# Patient Record
Sex: Female | Born: 1955 | Race: Black or African American | Hispanic: No | Marital: Married | Smoking: Never smoker
Health system: Southern US, Community
[De-identification: ages and names within clinical notes are randomized; demographics above are authoritative.]

## PROBLEM LIST (undated history)

## (undated) DIAGNOSIS — R51 Headache: Secondary | ICD-10-CM

## (undated) DIAGNOSIS — M5126 Other intervertebral disc displacement, lumbar region: Secondary | ICD-10-CM

## (undated) DIAGNOSIS — I2510 Atherosclerotic heart disease of native coronary artery without angina pectoris: Secondary | ICD-10-CM

## (undated) DIAGNOSIS — I10 Essential (primary) hypertension: Secondary | ICD-10-CM

## (undated) DIAGNOSIS — H269 Unspecified cataract: Secondary | ICD-10-CM

## (undated) DIAGNOSIS — E7800 Pure hypercholesterolemia, unspecified: Secondary | ICD-10-CM

## (undated) DIAGNOSIS — K219 Gastro-esophageal reflux disease without esophagitis: Secondary | ICD-10-CM

## (undated) DIAGNOSIS — R519 Headache, unspecified: Secondary | ICD-10-CM

## (undated) DIAGNOSIS — T7840XA Allergy, unspecified, initial encounter: Secondary | ICD-10-CM

## (undated) HISTORY — DX: Other intervertebral disc displacement, lumbar region: M51.26

## (undated) HISTORY — DX: Essential (primary) hypertension: I10

## (undated) HISTORY — DX: Unspecified cataract: H26.9

## (undated) HISTORY — DX: Allergy, unspecified, initial encounter: T78.40XA

---

## 2013-10-19 ENCOUNTER — Ambulatory Visit (INDEPENDENT_AMBULATORY_CARE_PROVIDER_SITE_OTHER): Payer: No Typology Code available for payment source | Admitting: Physician Assistant

## 2013-10-19 VITALS — BP 126/88 | HR 77 | Temp 98.2°F | Resp 16 | Ht 60.75 in | Wt 183.0 lb

## 2013-10-19 DIAGNOSIS — Z111 Encounter for screening for respiratory tuberculosis: Secondary | ICD-10-CM

## 2013-10-19 DIAGNOSIS — J309 Allergic rhinitis, unspecified: Secondary | ICD-10-CM

## 2013-10-19 DIAGNOSIS — I10 Essential (primary) hypertension: Secondary | ICD-10-CM

## 2013-10-19 DIAGNOSIS — E785 Hyperlipidemia, unspecified: Secondary | ICD-10-CM

## 2013-10-19 DIAGNOSIS — K219 Gastro-esophageal reflux disease without esophagitis: Secondary | ICD-10-CM

## 2013-10-19 LAB — LIPID PANEL
CHOLESTEROL: 229 mg/dL — AB (ref 0–200)
HDL: 108 mg/dL (ref >39)
LDL Cholesterol: 111 mg/dL — ABNORMAL HIGH (ref 0–99)
TRIGLYCERIDES: 51 mg/dL (ref <150)
Total CHOL/HDL Ratio: 2.1 Ratio
VLDL: 10 mg/dL (ref 0–40)

## 2013-10-19 LAB — COMPREHENSIVE METABOLIC PANEL
ALT: 26 U/L (ref 0–35)
AST: 26 U/L (ref 0–37)
Albumin: 4.7 g/dL (ref 3.5–5.2)
Alkaline Phosphatase: 52 U/L (ref 39–117)
BILIRUBIN TOTAL: 0.4 mg/dL (ref 0.2–1.2)
BUN: 15 mg/dL (ref 6–23)
CHLORIDE: 104 meq/L (ref 96–112)
CO2: 29 mEq/L (ref 19–32)
Calcium: 10.2 mg/dL (ref 8.4–10.5)
Creat: 0.87 mg/dL (ref 0.50–1.10)
GLUCOSE: 95 mg/dL (ref 70–99)
Potassium: 4.2 mEq/L (ref 3.5–5.3)
SODIUM: 141 meq/L (ref 135–145)
Total Protein: 7.6 g/dL (ref 6.0–8.3)

## 2013-10-19 MED ORDER — HYDROCHLOROTHIAZIDE 25 MG PO TABS: 25.0000 mg | ORAL_TABLET | Freq: Every day | ORAL | Status: DC

## 2013-10-19 MED ORDER — FLUTICASONE PROPIONATE 50 MCG/ACT NA SUSP: 2.0000 | Freq: Every day | NASAL | Status: DC

## 2013-10-19 MED ORDER — ESOMEPRAZOLE MAGNESIUM 10 MG PO PACK
10.0000 mg | PACK | Freq: Every day | ORAL | Status: DC
Start: 2013-10-19 — End: 2014-04-01

## 2013-10-19 MED ORDER — LORATADINE 10 MG PO TABS: 10.0000 mg | ORAL_TABLET | Freq: Every day | ORAL | Status: DC

## 2013-10-19 MED ORDER — PRAVASTATIN SODIUM 80 MG PO TABS: 80.0000 mg | ORAL_TABLET | Freq: Every day | ORAL | Status: DC

## 2013-10-19 NOTE — Progress Notes (Signed)
Subjective:    Patient ID: Malena Edman, female    DOB: 29-Oct-1955, 58 y.o.   MRN: [id number]  HPI Primary Physician: No primary provider on file.  Chief Complaint: Medication refill, allergies, and PPD  HPI: 58 y.o. female with history below presents for several issues. Recently moved here from [facility] She moved here   1) Medication refill: Needs refills of HCTZ 25 mg and pravastatin 80 mg. Has been on these for over 10 years. Sweets and breads are her weaknesses. She does walk daily.   2) Allergies: Long term allergy issues her whole life. Has been on Claritin 10 mg daily. This helps for the most part, but sometimes her ears bother her. They feel full sometimes. She has to deal with itchy watery eyes at times.   3) PPD: Needs for CNA school. States her aunt did have TB. Aunt was treated. Patient does nto remember if she received PPX treatment or not. Also had a remote history of crack/cocaine in the 1980's. She states that she was never addicted. She checked herself into the hospital for help. Has been clean since. Asymptomatic from possible TB exposure early in life.    Past Medical History  Diagnosis Date  . Allergy   . Cataract   . Hypertension      Home Meds: Prior to Admission medications   Medication Sig Start Date End Date Taking? Authorizing Provider  esomeprazole (NEXIUM) 10 MG packet Take 10 mg by mouth daily before breakfast.   Yes Historical Provider, MD  hydrochlorothiazide (HYDRODIURIL) 25 MG tablet Take 25 mg by mouth daily.   Yes Historical Provider, MD  loratadine (CLARITIN) 10 MG tablet Take 10 mg by mouth daily.   Yes Historical Provider, MD  pravastatin (PRAVACHOL) 80 MG tablet Take 80 mg by mouth daily.   Yes Historical Provider, MD    Allergies:  Allergies  Allergen Reactions  . Flagyl [Metronidazole] Rash    History   Social History  . Marital Status: Married    Spouse Name: N/A    Number of Children: N/A  . Years of Education: N/A   Occupational  History  . Not on file.   Social History Main Topics  . Smoking status: Never Smoker   . Smokeless tobacco: Not on file  . Alcohol Use: No  . Drug Use: No  . Sexual Activity: Not on file   Other Topics Concern  . Not on file   Social History Narrative  . No narrative on file      Review of Systems  Constitutional: Negative for fever, chills and fatigue.  HENT: Positive for congestion, ear pain, hearing loss, postnasal drip, rhinorrhea, sinus pressure and sneezing. Negative for sore throat and tinnitus.   Eyes: Positive for discharge and itching.       Watery.   Respiratory: Negative for cough, chest tightness and wheezing.   Cardiovascular: Negative for chest pain.  Neurological: Positive for headaches.       Objective:   Physical Exam  Physical Exam: Blood pressure 126/88, pulse 77, temperature 98.2 F (36.8 C), temperature source Oral, resp. rate 16, height 5' 0.75" (1.543 m), weight 183 lb (83.008 kg), SpO2 96.00%., Body mass index is 34.86 kg/(m^2). General: Well developed, well nourished, in no acute distress. Head: Normocephalic, atraumatic, eyes without discharge, sclera non-icteric, nares are without discharge. Bilateral auditory canals clear, TM's are without perforation, pearly grey and translucent with reflective cone of light bilaterally. Oral cavity moist, posterior pharynx without exudate, erythema,  peritonsillar abscess, or post nasal drip. Uvula midline.   Neck: Supple. No thyromegaly. Full ROM. No lymphadenopathy. Lungs: Clear bilaterally to auscultation without wheezes, rales, or rhonchi. Breathing is unlabored. Heart: RRR with S1 S2. No murmurs, rubs, or gallops appreciated. Msk:  Strength and tone normal for age. Extremities/Skin: Warm and dry. No clubbing or cyanosis. No edema. No rashes or suspicious lesions. Neuro: Alert and oriented X 3. Moves all extremities spontaneously. Gait is normal. CNII-XII grossly in tact. Psych:  Responds to questions  appropriately with a normal affect.   Labs: CMP and lipid pending     Assessment & Plan:  58 year old female here for PPD with hyperlipidemia, hypertension, and allergies.  1) Hyperlipidemia -Await labs -Pravastatin 80 mg 1 po qhs #30 RF 5 -Healthy diet and exercise -Weight loss -Cut back on the sweets and bread  2) Hypertension -Well controlled -Continue HCTZ 25 mg 1 po daily #30 RF 5 -Avoid beta blocker usage in the future as she does have a remote history of crack cocaine usage -Healthy diet and exercise -Weight loss  3) Allergies -Add Flonase 2 sprays each nare daily #1 RF 6 -Continue Claritin 10 mg 1 po daily #30 RF 5  4) PPD -Placed -RTC 48 72 hours for reading   Eula Listenyan Mayan Kloepfer, MHS, PA-C [facility] [address] [place], [place] [address] [phone] [facility] 10/19/2013 8:53 AM

## 2013-10-19 NOTE — Progress Notes (Signed)
  Tuberculosis Risk Questionnaire  1. No Were you born outside the [place] in one of the following parts of the world: [place], [place], [place], [place] or [place]?    2. No Have you traveled outside the [place] and lived for more than one month in one of the following parts of the world: [place], [place], [place], [place] or [place]?    3. No Do you have a compromised immune system such as from any of the following conditions:HIV/AIDS, organ or bone marrow transplantation, diabetes, immunosuppressive medicines (e.g. Prednisone, Remicaide), leukemia, lymphoma, cancer of the head or neck, gastrectomy or jejunal bypass, end-stage renal disease (on dialysis), or silicosis?     4. 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    5. Yes Did illegal drugs back in 80's and got treatment Have you ever: injected illegal drugs, used crack cocaine, lived in a homeless shelter  or been in jail or prison?     6. Yes Aunt had it years ago, none recently Have you ever been exposed to anyone with infectious tuberculosis?    Tuberculosis Symptom Questionnaire  Do you currently have any of the following symptoms?  1. No Unexplained cough lasting more than 3 weeks?   2. No Unexplained fever lasting more than 3 weeks.   3. No Night Sweats (sweating that leaves the bedclothes and sheets wet)     4. No Shortness of Breath   5. No Chest Pain   6. No Unintentional weight loss    7. No Unexplained fatigue (very tired for no reason)

## 2013-10-19 NOTE — Addendum Note (Signed)
Addended byAlden Benjamin: Montrell Cessna R on: 10/19/2013 10:08 AM   Modules accepted: Orders

## 2013-10-22 ENCOUNTER — Ambulatory Visit (INDEPENDENT_AMBULATORY_CARE_PROVIDER_SITE_OTHER): Payer: No Typology Code available for payment source | Admitting: Radiology

## 2013-10-22 DIAGNOSIS — Z111 Encounter for screening for respiratory tuberculosis: Secondary | ICD-10-CM

## 2013-10-22 LAB — TB SKIN TEST
Induration: 0 mm
TB SKIN TEST: NEGATIVE

## 2013-10-22 NOTE — Progress Notes (Signed)
   Subjective:    Patient ID: Casey Peterson, female    DOB: 09/10/1956, 58 y.o.   MRN: [id number]  HPI    Review of Systems     Objective:   Physical Exam        Assessment & Plan:

## 2014-04-01 ENCOUNTER — Ambulatory Visit (INDEPENDENT_AMBULATORY_CARE_PROVIDER_SITE_OTHER): Payer: No Typology Code available for payment source | Admitting: Family Medicine

## 2014-04-01 ENCOUNTER — Encounter: Payer: Self-pay | Admitting: Family Medicine

## 2014-04-01 VITALS — BP 113/86 | HR 69 | Temp 98.5°F | Resp 16 | Ht 62.5 in | Wt 185.2 lb

## 2014-04-01 DIAGNOSIS — J302 Other seasonal allergic rhinitis: Secondary | ICD-10-CM

## 2014-04-01 DIAGNOSIS — E785 Hyperlipidemia, unspecified: Secondary | ICD-10-CM

## 2014-04-01 DIAGNOSIS — J309 Allergic rhinitis, unspecified: Secondary | ICD-10-CM

## 2014-04-01 DIAGNOSIS — R42 Dizziness and giddiness: Secondary | ICD-10-CM

## 2014-04-01 DIAGNOSIS — I10 Essential (primary) hypertension: Secondary | ICD-10-CM

## 2014-04-01 LAB — POCT URINALYSIS DIPSTICK
Bilirubin, UA: NEGATIVE
Blood, UA: NEGATIVE
Glucose, UA: NEGATIVE
Ketones, UA: NEGATIVE
Leukocytes, UA: NEGATIVE
Nitrite, UA: NEGATIVE
Protein, UA: NEGATIVE
Spec Grav, UA: 1.015
Urobilinogen, UA: 0.2
pH, UA: 5.5

## 2014-04-01 LAB — COMPLETE METABOLIC PANEL WITH GFR
ALT: 32 U/L (ref 0–35)
Alkaline Phosphatase: 48 U/L (ref 39–117)
BUN: 12 mg/dL (ref 6–23)
CO2: 31 mEq/L (ref 19–32)
Chloride: 101 mEq/L (ref 96–112)
Creat: 0.89 mg/dL (ref 0.50–1.10)
GFR, Est African American: 82 mL/min
GFR, Est Non African American: 71 mL/min
Total Bilirubin: 0.4 mg/dL (ref 0.2–1.2)

## 2014-04-01 LAB — COMPLETE METABOLIC PANEL WITHOUT GFR
AST: 29 U/L (ref 0–37)
Albumin: 4.5 g/dL (ref 3.5–5.2)
Calcium: 9.6 mg/dL (ref 8.4–10.5)
Glucose, Bld: 93 mg/dL (ref 70–99)
Potassium: 3.5 meq/L (ref 3.5–5.3)
Sodium: 141 meq/L (ref 135–145)
Total Protein: 7.4 g/dL (ref 6.0–8.3)

## 2014-04-01 LAB — POCT UA - MICROSCOPIC ONLY
Casts, Ur, LPF, POC: NEGATIVE
Crystals, Ur, HPF, POC: NEGATIVE
Mucus, UA: NEGATIVE
Yeast, UA: NEGATIVE

## 2014-04-01 LAB — LIPID PANEL
Cholesterol: 226 mg/dL — ABNORMAL HIGH (ref 0–200)
HDL: 114 mg/dL (ref >39)
LDL Cholesterol: 101 mg/dL — ABNORMAL HIGH (ref 0–99)
Total CHOL/HDL Ratio: 2 Ratio
Triglycerides: 54 mg/dL (ref <150)
VLDL: 11 mg/dL (ref 0–40)

## 2014-04-01 MED ORDER — FLUTICASONE PROPIONATE 50 MCG/ACT NA SUSP: 2.0000 | Freq: Every day | NASAL | Status: DC

## 2014-04-01 MED ORDER — OMEPRAZOLE 20 MG PO CPDR: 20.0000 mg | DELAYED_RELEASE_CAPSULE | Freq: Every day | ORAL | Status: DC

## 2014-04-01 MED ORDER — PRAVASTATIN SODIUM 80 MG PO TABS: 80.0000 mg | ORAL_TABLET | Freq: Every day | ORAL | Status: DC

## 2014-04-01 MED ORDER — HYDROCHLOROTHIAZIDE 25 MG PO TABS: 25.0000 mg | ORAL_TABLET | Freq: Every day | ORAL | Status: DC

## 2014-04-01 NOTE — Progress Notes (Signed)
Chief Complaint:  Chief Complaint  Patient presents with  . Establish Care    and medication refills - HCTZ and PRAVASTATIN    HPI: Casey Peterson is a 58 y.o. female who is here for Dizzsiness with standing and sitting, no UTI sxs/fevers/chills/n/v/adb pain/chest pain /SOB or palpitations. No ear pain or URI sxs HTN and lipids well controlled, dx over 10 years ago No SEs from meds that she knows of, she ahs been trying to keep hydrated Has allergies  And has been taking otc meds, and has  GERD, nexium too expensive so would like something else less expensive She ahs no dizziness with ROM , no neuro sxs, just usually when she bends over and gets up too quickly No h/o diabetes  Past Medical History  Diagnosis Date  . Allergy   . Cataract   . Hypertension    No past surgical history on file. History   Social History  . Marital Status: Married    Spouse Name: N/A    Number of Children: N/A  . Years of Education: N/A   Social History Main Topics  . Smoking status: Never Smoker   . Smokeless tobacco: None  . Alcohol Use: No  . Drug Use: No  . Sexual Activity: None   Other Topics Concern  . None   Social History Narrative  . None   Family History  Problem Relation Age of Onset  . Cancer Mother   . Diabetes Mother   . Heart disease Mother   . Hyperlipidemia Mother   . Hypertension Mother   . Stroke Mother   . Stroke Father   . Hypertension Father   . Hyperlipidemia Sister   . Stroke Sister   . Diabetes Sister   . Diabetes Sister    Allergies  Allergen Reactions  . Flagyl [Metronidazole] Rash   Prior to Admission medications   Medication Sig Start Date End Date Taking? Authorizing Provider  esomeprazole (NEXIUM) 10 MG packet Take 10 mg by mouth daily before breakfast. 10/19/13  Yes Ryan M Dunn, PA-C  fluticasone (FLONASE) 50 MCG/ACT nasal spray Place 2 sprays into both nostrils daily. 10/19/13  Yes Ryan M Dunn, PA-C  hydrochlorothiazide (HYDRODIURIL) 25 MG  tablet Take 1 tablet (25 mg total) by mouth daily. 10/19/13  Yes Ryan M Dunn, PA-C  loratadine (CLARITIN) 10 MG tablet Take 1 tablet (10 mg total) by mouth daily. 10/19/13  Yes Ryan M Dunn, PA-C  pravastatin (PRAVACHOL) 80 MG tablet Take 1 tablet (80 mg total) by mouth daily. 10/19/13  Yes Ryan M Dunn, PA-C     ROS: The patient denies fevers, chills, night sweats, unintentional weight loss, chest pain, palpitations, wheezing, dyspnea on exertion, nausea, vomiting, abdominal pain, dysuria, hematuria, melena, numbness, weakness, or tingling.   All other systems have been reviewed and were otherwise negative with the exception of those mentioned in the HPI and as above.    PHYSICAL EXAM: Filed Vitals:   04/01/14 1051  BP: 113/86  Pulse: 69  Temp: 98.5 F (36.9 C)  Resp: 16   Filed Vitals:   04/01/14 1051  Height: 5' 2.5" (1.588 m)  Weight: 185 lb 3.2 oz (84.006 kg)   Body mass index is 33.31 kg/(m^2).  General: Alert, no acute distress HEENT:  Normocephalic, atraumatic, oropharynx patent. EOMI, PERRLA, fundo exam grossly nl, TM normla Cardiovascular:  Regular rate and rhythm, no rubs murmurs or gallops.  No Carotid bruits, radial pulse intact. No pedal edema.  Respiratory: Clear to auscultation bilaterally.  No wheezes, rales, or rhonchi.  No cyanosis, no use of accessory musculature GI: No organomegaly, abdomen is soft and non-tender, positive bowel sounds.  No masses. Skin: No rashes. Neurologic: Facial musculature symmetric. CN 2-12 grossly normal. Neg romberg Psychiatric: Patient is appropriate throughout our interaction. Lymphatic: No cervical lymphadenopathy Musculoskeletal: Gait intact. 5/5 sstrength, 2/2 DTRs   LABS: Results for orders placed in visit on 04/01/14  COMPLETE METABOLIC PANEL WITH GFR      Result Value Ref Range   Sodium 141  135 - 145 mEq/L   Potassium 3.5  3.5 - 5.3 mEq/L   Chloride 101  96 - 112 mEq/L   CO2 31  19 - 32 mEq/L   Glucose, Bld 93  70 - 99  mg/dL   BUN 12  6 - 23 mg/dL   Creat 1.61  0.96 - 0.45 mg/dL   Total Bilirubin 0.4  0.2 - 1.2 mg/dL   Alkaline Phosphatase 48  39 - 117 U/L   AST 29  0 - 37 U/L   ALT 32  0 - 35 U/L   Total Protein 7.4  6.0 - 8.3 g/dL   Albumin 4.5  3.5 - 5.2 g/dL   Calcium 9.6  8.4 - 40.9 mg/dL   GFR, Est African American 82     GFR, Est Non African American 71    LIPID PANEL      Result Value Ref Range   Cholesterol 226 (*) 0 - 200 mg/dL   Triglycerides 54  <811 mg/dL   HDL 914  >78 mg/dL   Total CHOL/HDL Ratio 2.0     VLDL 11  0 - 40 mg/dL   LDL Cholesterol 295 (*) 0 - 99 mg/dL  TSH      Result Value Ref Range   TSH 0.743  0.350 - 4.500 uIU/mL  POCT URINALYSIS DIPSTICK      Result Value Ref Range   Color, UA yellow     Clarity, UA clear     Glucose, UA neg     Bilirubin, UA neg     Ketones, UA neg     Spec Grav, UA 1.015     Blood, UA neg     pH, UA 5.5     Protein, UA neg     Urobilinogen, UA 0.2     Nitrite, UA neg     Leukocytes, UA Negative    POCT UA - MICROSCOPIC ONLY      Result Value Ref Range   WBC, Ur, HPF, POC 0-1     RBC, urine, microscopic 0-1     Bacteria, U Microscopic trace     Mucus, UA neg     Epithelial cells, urine per micros 0-1     Crystals, Ur, HPF, POC neg     Casts, Ur, LPF, POC neg     Yeast, UA neg       EKG/XRAY:   Primary read interpreted by Dr. Conley Rolls at [facility].   ASSESSMENT/PLAN: Encounter Diagnoses  Name Primary?  . Essential hypertension Yes  . Other and unspecified hyperlipidemia   . Seasonal allergies   . Allergic rhinitis, cause unspecified   . Dizziness and giddiness    Orthostatics normal, UA normal, neuro and eye exam normal Advise to move  slowly when changing positions Labs pending F/u prn or in 6 months  Gross sideeffects, risk and benefits, and alternatives of medications d/w patient. Patient is aware that all medications  have potential sideeffects and we are unable to predict every sideeffect or drug-drug interaction that may  occur.  Traci Plemons PHUONG, DO 04/04/2014 7:22 AM

## 2014-04-02 LAB — TSH: TSH: 0.743 u[IU]/mL (ref 0.350–4.500)

## 2014-04-11 ENCOUNTER — Ambulatory Visit (INDEPENDENT_AMBULATORY_CARE_PROVIDER_SITE_OTHER): Payer: No Typology Code available for payment source | Admitting: Emergency Medicine

## 2014-04-11 VITALS — BP 114/80 | HR 72 | Temp 98.1°F | Resp 16 | Ht 62.0 in | Wt 184.8 lb

## 2014-04-11 DIAGNOSIS — J309 Allergic rhinitis, unspecified: Secondary | ICD-10-CM

## 2014-04-11 DIAGNOSIS — H9201 Otalgia, right ear: Secondary | ICD-10-CM

## 2014-04-11 DIAGNOSIS — H9209 Otalgia, unspecified ear: Secondary | ICD-10-CM

## 2014-04-11 DIAGNOSIS — Z23 Encounter for immunization: Secondary | ICD-10-CM

## 2014-04-11 NOTE — Patient Instructions (Signed)

## 2014-04-11 NOTE — Progress Notes (Signed)
[facility] [address], [place] [address] [phone]  Date:  04/11/2014   Name:  Casey EdmanMary Ricard   DOB:  10/08/1955   MRN:  [id number]  PCP:  Rockne CoonsLE, THAO PHUONG, DO    Chief Complaint: Otalgia and Annual Exam   History of Present Illness:  Casey EdmanMary Sagar is a 58 y.o. very pleasant female patient who presents with the following:  Comes with "muscle spasms" in right ear. History of ear pain in past.  Saw Dr Conley RollsLe for labs last week with physical.   No fever or chills.  No nausea or vomiting.  No cough or coryza.  No improvement with over the counter medications or other home remedies. Denies other complaint or health concern today.   There are no active problems to display for this patient.   Past Medical History  Diagnosis Date  . Allergy   . Cataract   . Hypertension     History reviewed. No pertinent past surgical history.  History  Substance Use Topics  . Smoking status: Never Smoker   . Smokeless tobacco: Not on file  . Alcohol Use: No    Family History  Problem Relation Age of Onset  . Cancer Mother   . Diabetes Mother   . Heart disease Mother   . Hyperlipidemia Mother   . Hypertension Mother   . Stroke Mother   . Stroke Father   . Hypertension Father   . Hyperlipidemia Sister   . Stroke Sister   . Diabetes Sister   . Diabetes Sister     Allergies  Allergen Reactions  . Flagyl [Metronidazole] Rash    Medication list has been reviewed and updated.  Current Outpatient Prescriptions on File Prior to Visit  Medication Sig Dispense Refill  . fluticasone (FLONASE) 50 MCG/ACT nasal spray Place 2 sprays into both nostrils daily.  16 g  6  . hydrochlorothiazide (HYDRODIURIL) 25 MG tablet Take 1 tablet (25 mg total) by mouth daily.  30 tablet  5  . loratadine (CLARITIN) 10 MG tablet Take 1 tablet (10 mg total) by mouth daily.  30 tablet  5  . omeprazole (PRILOSEC) 20 MG capsule Take 1 capsule (20 mg total) by mouth daily.  30 capsule  5  .  pravastatin (PRAVACHOL) 80 MG tablet Take 1 tablet (80 mg total) by mouth daily.  30 tablet  5   No current facility-administered medications on file prior to visit.    Review of Systems:  As per HPI, otherwise negative.    Physical Examination: Filed Vitals:   04/11/14 0948  BP: 114/80  Pulse: 72  Temp: 98.1 F (36.7 C)  Resp: 16   Filed Vitals:   04/11/14 0948  Height: 5\' 2"  (1.575 m)  Weight: 184 lb 12.8 oz (83.825 kg)   Body mass index is 33.79 kg/(m^2). Ideal Body Weight: Weight in (lb) to have BMI = 25: 136.4   GEN: WDWN, NAD, Non-toxic, A & O x 3 HEENT: Atraumatic, Normocephalic. Neck supple. No masses, No LAD.  Valsalva positive Ears and Nose: No external deformity. CV: RRR, No M/G/R. No JVD. No thrill. No extra heart sounds. PULM: CTA B, no wheezes, crackles, rhonchi. No retractions. No resp. distress. No accessory muscle use. ABD: S, NT, ND, +BS. No rebound. No HSM. EXTR: No c/c/e NEURO Normal gait.  PSYCH: Normally interactive. Conversant. Not depressed or anxious appearing.  Calm demeanor.   Assessment and Plan: Otalgia  Signed,  Phillips OdorJeffery Anderson, MD

## 2014-04-29 ENCOUNTER — Encounter: Payer: No Typology Code available for payment source | Admitting: Family Medicine

## 2014-09-30 ENCOUNTER — Ambulatory Visit: Payer: No Typology Code available for payment source | Admitting: Family Medicine

## 2014-10-05 ENCOUNTER — Ambulatory Visit: Payer: No Typology Code available for payment source | Admitting: Family Medicine

## 2014-10-29 ENCOUNTER — Other Ambulatory Visit: Payer: Self-pay | Admitting: Family Medicine

## 2014-10-29 ENCOUNTER — Other Ambulatory Visit: Payer: Self-pay | Admitting: Physician Assistant

## 2014-12-14 ENCOUNTER — Ambulatory Visit (INDEPENDENT_AMBULATORY_CARE_PROVIDER_SITE_OTHER): Payer: 59 | Admitting: Family Medicine

## 2014-12-14 VITALS — BP 116/82 | HR 71 | Temp 97.9°F | Resp 18 | Ht 62.0 in | Wt 176.0 lb

## 2014-12-14 DIAGNOSIS — K219 Gastro-esophageal reflux disease without esophagitis: Secondary | ICD-10-CM | POA: Diagnosis not present

## 2014-12-14 DIAGNOSIS — Z124 Encounter for screening for malignant neoplasm of cervix: Secondary | ICD-10-CM

## 2014-12-14 DIAGNOSIS — I10 Essential (primary) hypertension: Secondary | ICD-10-CM | POA: Insufficient documentation

## 2014-12-14 DIAGNOSIS — E785 Hyperlipidemia, unspecified: Secondary | ICD-10-CM

## 2014-12-14 DIAGNOSIS — J302 Other seasonal allergic rhinitis: Secondary | ICD-10-CM | POA: Diagnosis not present

## 2014-12-14 DIAGNOSIS — Z130 Encounter for screening for diseases of the blood and blood-forming organs and certain disorders involving the immune mechanism: Secondary | ICD-10-CM | POA: Diagnosis not present

## 2014-12-14 LAB — COMPREHENSIVE METABOLIC PANEL
ALBUMIN: 4.5 g/dL (ref 3.5–5.2)
ALK PHOS: 46 U/L (ref 39–117)
ALT: 35 U/L (ref 0–35)
AST: 31 U/L (ref 0–37)
BUN: 13 mg/dL (ref 6–23)
CALCIUM: 10.2 mg/dL (ref 8.4–10.5)
CHLORIDE: 101 meq/L (ref 96–112)
CO2: 31 meq/L (ref 19–32)
CREATININE: 0.81 mg/dL (ref 0.50–1.10)
Glucose, Bld: 99 mg/dL (ref 70–99)
POTASSIUM: 3.9 meq/L (ref 3.5–5.3)
Sodium: 141 mEq/L (ref 135–145)
Total Bilirubin: 0.5 mg/dL (ref 0.2–1.2)
Total Protein: 7.8 g/dL (ref 6.0–8.3)

## 2014-12-14 LAB — CBC
HCT: 39.6 % (ref 36.0–46.0)
Hemoglobin: 13.9 g/dL (ref 12.0–15.0)
MCH: 28.7 pg (ref 26.0–34.0)
MCHC: 35.1 g/dL (ref 30.0–36.0)
MCV: 81.8 fL (ref 78.0–100.0)
MPV: 9.5 fL (ref 8.6–12.4)
Platelets: 291 10*3/uL (ref 150–400)
RBC: 4.84 MIL/uL (ref 3.87–5.11)
RDW: 14.4 % (ref 11.5–15.5)
WBC: 5.8 10*3/uL (ref 4.0–10.5)

## 2014-12-14 LAB — LIPID PANEL
Cholesterol: 238 mg/dL — ABNORMAL HIGH (ref 0–200)
HDL: 134 mg/dL (ref >=46)
LDL CALC: 92 mg/dL (ref 0–99)
TRIGLYCERIDES: 58 mg/dL (ref <150)
Total CHOL/HDL Ratio: 1.8 Ratio
VLDL: 12 mg/dL (ref 0–40)

## 2014-12-14 MED ORDER — HYDROCHLOROTHIAZIDE 25 MG PO TABS: 25.0000 mg | ORAL_TABLET | Freq: Every day | ORAL | Status: DC

## 2014-12-14 MED ORDER — PRAVASTATIN SODIUM 80 MG PO TABS: 80.0000 mg | ORAL_TABLET | Freq: Every day | ORAL | Status: DC

## 2014-12-14 MED ORDER — FLUTICASONE PROPIONATE 50 MCG/ACT NA SUSP: 2.0000 | Freq: Every day | NASAL | Status: DC

## 2014-12-14 MED ORDER — OMEPRAZOLE 20 MG PO CPDR: 20.0000 mg | DELAYED_RELEASE_CAPSULE | Freq: Every day | ORAL | Status: DC

## 2014-12-14 NOTE — Patient Instructions (Addendum)
Good to see you today!   I will be in touch with your labs Continue to use your medications for blood pressure and cholesterol However you might try stopping your omeprazole for a while and see if you have heartburn. If you can use this just as needd it would be better  Please call and schedule a mammogram:    [facility]  Address: [address], [place], [place] [address]  [phone]  You are also overdue for a colonoscopy.  This can be arranged with the GI doctor of your choice such as [facility]

## 2014-12-14 NOTE — Progress Notes (Signed)
[facility] [address], [place] [address] [phone]  Date:  12/14/2014   Name:  Casey EdmanMary Mahoney   DOB:  06/01/1956   MRN:  [id number]  PCP:  Rockne CoonsLE, THAO PHUONG, DO    Chief Complaint: rx refills   History of Present Illness:  Casey Peterson is a 59 y.o. very pleasant female patient who presents with the following:  Here today for a medication refill.   She is fasting today and would like to have labs She has not had a mammogram, no recent pap.  She would be willing to have a pap today- no history of abnormal  She had a colonoscopy about 10 years ago in [facility]- time to repeat this She has been taking omeprazole for a year or so; she was not sure if she was supposed to stay on this for good.  Histoyr of GERD but no ulcer as far as she knows  There are no active problems to display for this patient.   Past Medical History  Diagnosis Date  . Allergy   . Cataract   . Hypertension     History reviewed. No pertinent past surgical history.  History  Substance Use Topics  . Smoking status: Never Smoker   . Smokeless tobacco: Not on file  . Alcohol Use: No    Family History  Problem Relation Age of Onset  . Cancer Mother   . Diabetes Mother   . Heart disease Mother   . Hyperlipidemia Mother   . Hypertension Mother   . Stroke Mother   . Stroke Father   . Hypertension Father   . Hyperlipidemia Sister   . Stroke Sister   . Diabetes Sister   . Diabetes Sister     Allergies  Allergen Reactions  . Flagyl [Metronidazole] Rash    Medication list has been reviewed and updated.  Current Outpatient Prescriptions on File Prior to Visit  Medication Sig Dispense Refill  . fluticasone (FLONASE) 50 MCG/ACT nasal spray Place 2 sprays into both nostrils daily. 16 g 6  . hydrochlorothiazide (HYDRODIURIL) 25 MG tablet Take 1 tablet (25 mg total) by mouth daily. 30 tablet 5  . loratadine (CLARITIN) 10 MG tablet Take 1 tablet (10 mg total) by mouth daily. 30 tablet 5  .  omeprazole (PRILOSEC) 20 MG capsule Take 1 capsule (20 mg total) by mouth daily. PATIENT NEEDS OFFICE VISIT FOR ADDITIONAL REFILLS 30 capsule 0  . pravastatin (PRAVACHOL) 80 MG tablet Take 1 tablet (80 mg total) by mouth daily. 30 tablet 5   No current facility-administered medications on file prior to visit.    Review of Systems:  As per HPI- otherwise negative.   Physical Examination: Filed Vitals:   12/14/14 0906  BP: 116/82  Pulse: 71  Temp: 97.9 F (36.6 C)  Resp: 18   Filed Vitals:   12/14/14 0906  Height: 5\' 2"  (1.575 m)  Weight: 176 lb (79.833 kg)   Body mass index is 32.18 kg/(m^2). Ideal Body Weight: Weight in (lb) to have BMI = 25: 136.4  GEN: WDWN, NAD, Non-toxic, A & O x 3, overweight, looks well HEENT: Atraumatic, Normocephalic. Neck supple. No masses, No LAD.  Bilateral TM wnl, oropharynx normal.  PEERL,EOMI.   Ears and Nose: No external deformity. CV: RRR, No M/G/R. No JVD. No thrill. No extra heart sounds. PULM: CTA B, no wheezes, crackles, rhonchi. No retractions. No resp. distress. No accessory muscle use. ABD: S, NT, ND. No rebound. No  HSM. EXTR: No c/c/e NEURO Normal gait.  PSYCH: Normally interactive. Conversant. Not depressed or anxious appearing.  Calm demeanor.  Pelvic: normal, no vaginal lesions or discharge. Uterus normal, no CMT, no adnexal tendereness or masses     Assessment and Plan: Essential hypertension - Plan: Comprehensive metabolic panel, hydrochlorothiazide (HYDRODIURIL) 25 MG tablet  Other seasonal allergic rhinitis - Plan: fluticasone (FLONASE) 50 MCG/ACT nasal spray  Hyperlipidemia - Plan: Lipid panel, pravastatin (PRAVACHOL) 80 MG tablet  Gastroesophageal reflux disease, esophagitis presence not specified - Plan: omeprazole (PRILOSEC) 20 MG capsule  Screening for deficiency anemia - Plan: CBC  Screening for cervical cancer - Plan: Pap IG and HPV (high risk) DNA detection  Caught up on her pap, labs pending as above,  refilled medication Will plan further follow- up pending labs. Encouraged her to try stopping her PPI to see if she can do without it  Signed Abbe Amsterdam, MD

## 2014-12-16 ENCOUNTER — Encounter: Payer: Self-pay | Admitting: Family Medicine

## 2014-12-16 LAB — PAP IG AND HPV HIGH-RISK: HPV DNA High Risk: NOT DETECTED

## 2015-02-21 ENCOUNTER — Encounter: Payer: Self-pay | Admitting: *Deleted

## 2016-01-31 ENCOUNTER — Other Ambulatory Visit: Payer: Self-pay | Admitting: Family Medicine

## 2016-02-01 ENCOUNTER — Other Ambulatory Visit: Payer: Self-pay | Admitting: Emergency Medicine

## 2016-02-01 DIAGNOSIS — I10 Essential (primary) hypertension: Secondary | ICD-10-CM

## 2016-02-01 MED ORDER — HYDROCHLOROTHIAZIDE 25 MG PO TABS: 25.0000 mg | ORAL_TABLET | Freq: Every day | ORAL | Status: DC

## 2016-02-14 ENCOUNTER — Other Ambulatory Visit: Payer: Self-pay | Admitting: Family Medicine

## 2016-02-22 ENCOUNTER — Encounter (HOSPITAL_COMMUNITY): Payer: Self-pay | Admitting: Nurse Practitioner

## 2016-02-22 ENCOUNTER — Emergency Department (HOSPITAL_COMMUNITY)
Admission: EM | Admit: 2016-02-22 | Discharge: 2016-02-23 | Disposition: A | Discharge status: Home / Self Care | Payer: Self-pay | Attending: Emergency Medicine | Admitting: Emergency Medicine

## 2016-02-22 DIAGNOSIS — H9319 Tinnitus, unspecified ear: Secondary | ICD-10-CM | POA: Insufficient documentation

## 2016-02-22 DIAGNOSIS — E782 Mixed hyperlipidemia: Secondary | ICD-10-CM | POA: Insufficient documentation

## 2016-02-22 DIAGNOSIS — I10 Essential (primary) hypertension: Secondary | ICD-10-CM | POA: Insufficient documentation

## 2016-02-22 DIAGNOSIS — R42 Dizziness and giddiness: Secondary | ICD-10-CM | POA: Insufficient documentation

## 2016-02-22 HISTORY — DX: Gastro-esophageal reflux disease without esophagitis: K21.9

## 2016-02-22 HISTORY — DX: Pure hypercholesterolemia, unspecified: E78.00

## 2016-02-22 NOTE — ED Notes (Signed)
Pt states she has "stuffiness in her head" that is causing ringing in her ears. Remarks on hx of sinusitis, takes medications for it.

## 2016-02-22 NOTE — ED Provider Notes (Signed)
CSN: [id number]     Arrival date & time 02/22/16  2010 History  By signing my name below, I, Marisue HumbleMichelle Chaffee, attest that this documentation has been prepared under the direction and in the presence of non-physician practitioner, Jerre SimonJessica L Thoren Hosang, PA. Electronically Signed: Marisue HumbleMichelle Chaffee, Scribe. 02/23/2016. 12:09 AM   Chief Complaint  Patient presents with  . Tinnitus  . Nasal Congestion    The history is provided by the patient. No language interpreter was used.   HPI Comments:  Casey EdmanMary Peterson is a 60 y.o. female with PMHx of HTN, HLD, and allergies who presents to the Emergency Department complaining of intermittent episodes of "room spinning dizziness" onset last week with one episode tonight prompting her visit. Dizziness is not alleviated by laying down or sitting up, and worsens when laying down and closing her eyes. Pt reports associated constant, worsening tinnitus, congestion, cough and ear pressure for 2 weeks. She states the tinnitus is chronic. Pt states she had her ears "detoxed" which alleviated the ringing; it has returned in the past 2 weeks. Pt also reports she has had a dental abscess on the right side of her mouth for a year; she has seen a dentist and been taking antibiotics. Her dental pain has worsened in the past 2 weeks. Denies nausea, vomiting, recent illnesses, recent medication changes, abdominal pain, diarrhea, dysuria, hematuria, vision changes, recent headache, neck pain, ear pain, sore throat or numbness.   Past Medical History  Diagnosis Date  . Allergy   . Cataract   . Hypertension   . Hypercholesteremia   . GERD (gastroesophageal reflux disease)    History reviewed. No pertinent past surgical history. Family History  Problem Relation Age of Onset  . Cancer Mother   . Diabetes Mother   . Heart disease Mother   . Hyperlipidemia Mother   . Hypertension Mother   . Stroke Mother   . Stroke Father   . Hypertension Father   . Hyperlipidemia Sister   . Stroke  Sister   . Diabetes Sister   . Diabetes Sister    Social History  Substance Use Topics  . Smoking status: Never Smoker   . Smokeless tobacco: None  . Alcohol Use: No   OB History    No data available     Review of Systems  HENT: Positive for congestion, dental problem and tinnitus. Negative for ear pain and sore throat.   Eyes: Negative for visual disturbance.  Respiratory: Positive for cough.   Gastrointestinal: Negative for nausea, vomiting, abdominal pain and diarrhea.  Genitourinary: Negative for dysuria and hematuria.  Musculoskeletal: Negative for neck pain.  Neurological: Positive for dizziness. Negative for numbness and headaches.    Allergies  Flagyl  Home Medications   Prior to Admission medications   Medication Sig Start Date End Date Taking? Authorizing Provider  fluticasone (FLONASE) 50 MCG/ACT nasal spray Place 2 sprays into both nostrils daily. 12/14/14   Gwenlyn FoundJessica C Copland, MD  hydrochlorothiazide (HYDRODIURIL) 25 MG tablet Take 1 tablet (25 mg total) by mouth daily. 02/01/16   Gwenlyn FoundJessica C Copland, MD  loratadine (CLARITIN) 10 MG tablet Take 1 tablet (10 mg total) by mouth daily. 10/19/13   Sondra Bargesyan M Dunn, PA-C  meclizine (ANTIVERT) 25 MG tablet Take 1 tablet (25 mg total) by mouth once. 02/23/16   Jerre SimonJessica L Royal Vandevoort, PA  omeprazole (PRILOSEC) 20 MG capsule TAKE ONE CAPSULE BY MOUTH EVERY DAY 02/15/16   Gwenlyn FoundJessica C Copland, MD  pravastatin (PRAVACHOL) 80 MG tablet TAKE  1 TABLET BY MOUTH EVERY DAY 02/15/16   Gwenlyn Found Copland, MD   BP 134/86 mmHg  Pulse 70  Temp(Src) 98 F (36.7 C) (Oral)  Resp 16  SpO2 98%   Physical Exam  HENT:  Mouth/Throat: Mucous membranes are normal.   Constitutional: Pt is oriented to person, place, and time. Pt appears well-developed and well-nourished. No distress.  HENT:  Head: Normocephalic and atraumatic.  Mouth/Throat: Oropharynx is clear and moist.  Eyes: Conjunctivae and EOM are normal. Pupils are equal, round, and reactive to light. No  scleral icterus.  Ears: TM without erythema, edema, no perforation, non translucent, cloudy with fluid noted, no signs of infection No horizontal, vertical or rotational nystagmus  Neck: Normal range of motion. Neck supple.  Full active and passive ROM without pain No nuchal rigidity or meningeal signs  Cardiovascular: Normal rate, regular rhythm.   Pulmonary/Chest: Effort normal  No respiratory distress. No wheezes rhonchi or rales  Musculoskeletal: Normal range of motion.  Neurological: Pt. is alert and oriented to person, place, and time. No cranial nerve deficit.  Exhibits normal muscle tone. Coordination normal.  Mental Status:  Alert, oriented, thought content appropriate. Speech fluent without evidence of aphasia. Able to follow 2 step commands without difficulty.  Cranial Nerves:  II:  Peripheral visual fields grossly normal, pupils equal, round, reactive to light III,IV, VI: ptosis not present, extra-ocular motions intact bilaterally  V,VII: smile symmetric, facial light touch sensation equal VIII: hearing grossly normal bilaterally  IX,X: midline uvula rise  XI: bilateral shoulder shrug equal and strong XII: midline tongue extension  Motor:  5/5 in upper and lower extremities bilaterally  Sensory: light touch normal in all extremities.  Gait: normal gait and balance Skin: Skin is warm and dry. No rash noted. Pt is not diaphoretic.  Psychiatric: Pt has a normal mood and affect. Behavior is normal. Judgment and thought content normal.  Nursing note and vitals reviewed.   ED Course  Procedures  DIAGNOSTIC STUDIES:  Oxygen Saturation is 98% on RA, normal by my interpretation.    COORDINATION OF CARE:  12:08 AM Discussed treatment plan with pt at bedside and pt agreed to plan.  Labs Review Labs Reviewed - No data to display  Imaging Review No results found. I have personally reviewed and evaluated these images and lab results as part of my medical decision-making.    EKG Interpretation None      MDM   Final diagnoses:  Tinnitus, unspecified laterality  Dizziness   Patient tinnitus and dizziness. Patient states chronic allergies. She states her allergies have worsened within the past 2 weeks with increased sinus congestion and a feeling of ear fullness. Patient is a history of chronic intermittent tinnitus but it has returned with the onset of allergies.Her symptoms are likely 2/2 sinus congestion and increased in her ear pressure. Patient without neurological deficits on exam less likely intracranial etiology. Patient denies trauma or hitting her head. Patient without nystagmus on exam. Likely a mild case of benign positional vertigo. Gave the patient a dose of Antivert while in the ED. Discharged with a prescription for Antivert. Instructed patient to use Flonase to help with sinus congestion. Instructed patient to follow-up with her primary care provider tomorrow to be reevaluated. I discussed strict return precautions with the patient. She expressed understanding to the discharge instructions.  I personally performed the services described in this documentation, which was scribed in my presence. The recorded information has been reviewed and is accurate.  Jerre Simon, PA 02/23/16 0159  Pricilla Loveless, MD 02/26/16 [phone]

## 2016-02-23 MED ORDER — MECLIZINE HCL 25 MG PO TABS: 25.0000 mg | ORAL_TABLET | Freq: Once | ORAL | Status: DC

## 2016-02-23 MED ORDER — MECLIZINE HCL 25 MG PO TABS
25.0000 mg | ORAL_TABLET | Freq: Once | ORAL | Status: AC
  Administered 2016-02-23: 25 mg via ORAL
  Filled 2016-02-23: qty 1

## 2016-02-23 NOTE — Discharge Instructions (Signed)
Follow-up with your primary care provider tomorrow to be reevaluated for your dizziness and tinnitus. Take the Antivert as prescribed. Start taking Flonase 1 squirt in each nostril daily.  Return to the emergency department if your dizziness worsens, you experience loss of hearing, severe headache, visual changes, nausea, vomiting, fever, chills, any numbness/tingling or weakness.  Dizziness Dizziness is a common problem. It is a feeling of unsteadiness or light-headedness. You may feel like you are about to faint. Dizziness can lead to injury if you stumble or fall. Anyone can become dizzy, but dizziness is more common in older adults. This condition can be caused by a number of things, including medicines, dehydration, or illness. HOME CARE INSTRUCTIONS Taking these steps may help with your condition: Eating and Drinking  Drink enough fluid to keep your urine clear or pale yellow. This helps to keep you from becoming dehydrated. Try to drink more clear fluids, such as water.  Do not drink alcohol.  Limit your caffeine intake if directed by your health care provider.  Limit your salt intake if directed by your health care provider. Activity  Avoid making quick movements.  Rise slowly from chairs and steady yourself until you feel okay.  In the morning, first sit up on the side of the bed. When you feel okay, stand slowly while you hold onto something until you know that your balance is fine.  Move your legs often if you need to stand in one place for a long time. Tighten and relax your muscles in your legs while you are standing.  Do not drive or operate heavy machinery if you feel dizzy.  Avoid bending down if you feel dizzy. Place items in your home so that they are easy for you to reach without leaning over. Lifestyle  Do not use any tobacco products, including cigarettes, chewing tobacco, or electronic cigarettes. If you need help quitting, ask your health care provider.  Try  to reduce your stress level, such as with yoga or meditation. Talk with your health care provider if you need help. General Instructions  Watch your dizziness for any changes.  Take medicines only as directed by your health care provider. Talk with your health care provider if you think that your dizziness is caused by a medicine that you are taking.  Tell a friend or a family member that you are feeling dizzy. If he or she notices any changes in your behavior, have this person call your health care provider.  Keep all follow-up visits as directed by your health care provider. This is important. SEEK MEDICAL CARE IF:  Your dizziness does not go away.  Your dizziness or light-headedness gets worse.  You feel nauseous.  You have reduced hearing.  You have new symptoms.  You are unsteady on your feet or you feel like the room is spinning. SEEK IMMEDIATE MEDICAL CARE IF:  You vomit or have diarrhea and are unable to eat or drink anything.  You have problems talking, walking, swallowing, or using your arms, hands, or legs.  You feel generally weak.  You are not thinking clearly or you have trouble forming sentences. It may take a friend or family member to notice this.  You have chest pain, abdominal pain, shortness of breath, or sweating.  Your vision changes.  You notice any bleeding.  You have a headache.  You have neck pain or a stiff neck.  You have a fever.   This information is not intended to replace advice given  to you by your health care provider. Make sure you discuss any questions you have with your health care provider.   Document Released: 02/26/2001 Document Revised: 01/17/2015 Document Reviewed: 08/29/2014 Elsevier Interactive Patient Education 2016 [employer].  Tinnitus Tinnitus refers to hearing a sound when there is no actual source for that sound. This is often described as ringing in the ears. However, people with this condition may hear a variety  of noises. A person may hear the sound in one ear or in both ears.  The sounds of tinnitus can be soft, loud, or somewhere in between. Tinnitus can last for a few seconds or can be constant for days. It may go away without treatment and come back at various times. When tinnitus is constant or happens often, it can lead to other problems, such as trouble sleeping and trouble concentrating. Almost everyone experiences tinnitus at some point. Tinnitus that is long-lasting (chronic) or comes back often is a problem that may require medical attention.  CAUSES  The cause of tinnitus is often not known. In some cases, it can result from other problems or conditions, including:   Exposure to loud noises from machinery, music, or other sources.  Hearing loss.  Ear or sinus infections.  Earwax buildup.  A foreign object in the ear.  Use of certain medicines.  Use of alcohol and caffeine.  High blood pressure.  Heart diseases.  Anemia.  Allergies.  Meniere disease.  Thyroid problems.  Tumors.  An enlarged part of a weakened blood vessel (aneurysm). SYMPTOMS The main symptom of tinnitus is hearing a sound when there is no source for that sound. It may sound like:   Buzzing.  Roaring.  Ringing.  Blowing air, similar to the sound heard when you listen to a seashell.  Hissing.  Whistling.  Sizzling.  Humming.  Running water.  A sustained musical note. DIAGNOSIS  Tinnitus is diagnosed based on your symptoms. Your health care provider will do a physical exam. A comprehensive hearing exam (audiologic exam) will be done if your tinnitus:   Affects only one ear (unilateral).  Causes hearing difficulties.  Lasts 6 months or longer. You may also need to see a health care provider who specializes in hearing disorders (audiologist). You may be asked to complete a questionnaire to determine the severity of your tinnitus. Tests may be done to help determine the cause and to  rule out other conditions. These can include:  Imaging studies of your head and brain, such as:  A CT scan.  An MRI.  An imaging study of your blood vessels (angiogram). TREATMENT  Treating an underlying medical condition can sometimes make tinnitus go away. If your tinnitus continues, other treatments may include:  Medicines, such as certain antidepressants or sleeping aids.  Sound generators to mask the tinnitus. These include:  Tabletop sound machines that play relaxing sounds to help you fall asleep.  Wearable devices that fit in your ear and play sounds or music.  A small device that uses headphones to deliver a signal embedded in music (acoustic neural stimulation). In time, this may change the pathways of your brain and make you less sensitive to tinnitus. This device is used for very severe cases when no other treatment is working.  Therapy and counseling to help you manage the stress of living with tinnitus.  Using hearing aids or cochlear implants, if your tinnitus is related to hearing loss. HOME CARE INSTRUCTIONS  When possible, avoid being in loud places and  being exposed to loud sounds.  Wear hearing protection, such as earplugs, when you are exposed to loud noises.  Do not take stimulants, such as nicotine, alcohol, or caffeine.  Practice techniques for reducing stress, such as meditation, yoga, or deep breathing.  Use a white noise machine, a humidifier, or other devices to mask the sound of tinnitus.  Sleep with your head slightly raised. This may reduce the impact of tinnitus.  Try to get plenty of rest each night. SEEK MEDICAL CARE IF:  You have tinnitus in just one ear.  Your tinnitus continues for 3 weeks or longer without stopping.  Home care measures are not helping.  You have tinnitus after a head injury.  You have tinnitus along with any of the following:  Dizziness.  Loss of balance.  Nausea and vomiting.   This information is not  intended to replace advice given to you by your health care provider. Make sure you discuss any questions you have with your health care provider.   Document Released: 09/02/2005 Document Revised: 09/23/2014 Document Reviewed: 02/02/2014 Elsevier Interactive Patient Education [employer]2016 Elsevier Inc.

## 2016-09-11 ENCOUNTER — Other Ambulatory Visit: Payer: Self-pay | Admitting: Family Medicine

## 2016-09-11 DIAGNOSIS — E785 Hyperlipidemia, unspecified: Secondary | ICD-10-CM

## 2016-11-04 ENCOUNTER — Encounter (HOSPITAL_COMMUNITY): Payer: Self-pay | Admitting: Emergency Medicine

## 2016-11-04 ENCOUNTER — Emergency Department (HOSPITAL_COMMUNITY)
Admission: EM | Admit: 2016-11-04 | Discharge: 2016-11-04 | Disposition: A | Discharge status: Home / Self Care | Payer: Self-pay | Attending: Emergency Medicine | Admitting: Emergency Medicine

## 2016-11-04 DIAGNOSIS — Z7982 Long term (current) use of aspirin: Secondary | ICD-10-CM | POA: Insufficient documentation

## 2016-11-04 DIAGNOSIS — R112 Nausea with vomiting, unspecified: Secondary | ICD-10-CM | POA: Insufficient documentation

## 2016-11-04 DIAGNOSIS — Z79899 Other long term (current) drug therapy: Secondary | ICD-10-CM | POA: Insufficient documentation

## 2016-11-04 DIAGNOSIS — M545 Low back pain: Secondary | ICD-10-CM | POA: Insufficient documentation

## 2016-11-04 DIAGNOSIS — I10 Essential (primary) hypertension: Secondary | ICD-10-CM | POA: Insufficient documentation

## 2016-11-04 DIAGNOSIS — G8929 Other chronic pain: Secondary | ICD-10-CM | POA: Insufficient documentation

## 2016-11-04 MED ORDER — ONDANSETRON 4 MG PO TBDP
4.0000 mg | ORAL_TABLET | Freq: Once | ORAL | Status: AC
  Administered 2016-11-04: 4 mg via ORAL
  Filled 2016-11-04: qty 1

## 2016-11-04 MED ORDER — BUPIVACAINE HCL (PF) 0.5 % IJ SOLN
10.0000 mL | Freq: Once | INTRAMUSCULAR | Status: AC
  Administered 2016-11-04: 10 mL
  Filled 2016-11-04: qty 30

## 2016-11-04 NOTE — ED Notes (Signed)
Pt ambulated to the bathroom to void.   

## 2016-11-04 NOTE — ED Provider Notes (Signed)
[facility]-EMERGENCY DEPT Provider Note   CSN: [id number] Arrival date & time: 11/04/16  1145     History   Chief Complaint Chief Complaint  Patient presents with  . Emesis    HPI Casey Peterson is a 61 y.o. female.  The history is provided by the patient.  Emesis   This is a new problem. The current episode started 3 to 5 hours ago. The problem occurs 2 to 4 times per day. The problem has been resolved. The emesis has an appearance of stomach contents. There has been no fever. Pertinent negatives include no abdominal pain and no diarrhea. Risk factors include suspect food intake (ate a hotdog with mayonaise ketchup and mustard just prior to onset of symptoms).  Back Pain   This is a chronic problem. Episode onset: 10 years ago. The problem occurs constantly. The problem has not changed since onset.The pain is associated with no known injury. The pain is present in the lumbar spine. The quality of the pain is described as aching. The pain is moderate. The pain is the same all the time. Pertinent negatives include no abdominal pain. She has tried nothing for the symptoms. Risk factors include obesity and lack of exercise.    Past Medical History:  Diagnosis Date  . Allergy   . Cataract   . GERD (gastroesophageal reflux disease)   . Herniated disc, cervical   . Hypercholesteremia   . Hypertension     Patient Active Problem List   Diagnosis Date Noted  . HTN (hypertension) 12/14/2014  . Hyperlipidemia 12/14/2014    History reviewed. No pertinent surgical history.  OB History    No data available       Home Medications    Prior to Admission medications   Medication Sig Start Date End Date Taking? Authorizing Provider  Aspirin-Salicylamide-Caffeine (BC HEADACHE PO) Take 1 packet by mouth every 8 (eight) hours as needed. For headache   Yes Historical Provider, MD  fluticasone (FLONASE) 50 MCG/ACT nasal spray Place 2 sprays into both nostrils daily. 12/14/14  Yes Gwenlyn FoundJessica C Copland,  MD  hydrochlorothiazide (HYDRODIURIL) 25 MG tablet Take 1 tablet (25 mg total) by mouth daily. 02/01/16  Yes Gwenlyn FoundJessica C Copland, MD  loratadine (CLARITIN) 10 MG tablet Take 1 tablet (10 mg total) by mouth daily. 10/19/13  Yes Ryan M Dunn, PA-C  Multiple Vitamin (MULTIVITAMIN WITH MINERALS) TABS tablet Take 1 tablet by mouth daily.   Yes Historical Provider, MD  omega-3 acid ethyl esters (LOVAZA) 1 g capsule Take 1 g by mouth daily.   Yes Historical Provider, MD  omeprazole (PRILOSEC) 20 MG capsule TAKE ONE CAPSULE BY MOUTH EVERY DAY 02/15/16  Yes Gwenlyn FoundJessica C Copland, MD  pravastatin (PRAVACHOL) 80 MG tablet TAKE 1 TABLET BY MOUTH EVERY DAY 02/15/16  Yes Gwenlyn FoundJessica C Copland, MD  vitamin C (ASCORBIC ACID) 500 MG tablet Take 500 mg by mouth daily.   Yes Historical Provider, MD  meclizine (ANTIVERT) 25 MG tablet Take 1 tablet (25 mg total) by mouth once. Patient not taking: Reported on 11/04/2016 02/23/16   Jerre SimonJessica L Focht, PA  pravastatin (PRAVACHOL) 80 MG tablet TAKE 1 TABLET BY MOUTH EVERY DAY Patient not taking: Reported on 11/04/2016 09/11/16   Pearline CablesJessica C Copland, MD    Family History Family History  Problem Relation Age of Onset  . Cancer Mother   . Diabetes Mother   . Heart disease Mother   . Hyperlipidemia Mother   . Hypertension Mother   . Stroke Mother   .  Stroke Father   . Hypertension Father   . Hyperlipidemia Sister   . Stroke Sister   . Diabetes Sister   . Diabetes Sister     Social History Social History  Substance Use Topics  . Smoking status: Never Smoker  . Smokeless tobacco: Not on file  . Alcohol use No     Allergies   Flagyl [metronidazole]   Review of Systems Review of Systems  Gastrointestinal: Positive for vomiting. Negative for abdominal pain and diarrhea.  Musculoskeletal: Positive for back pain.  All other systems reviewed and are negative.    Physical Exam Updated Vital Signs BP 139/91 (BP Location: Left Arm)   Pulse 77   Temp 98.7 F (37.1 C) (Oral)    Resp 16   Ht 5\' 2"  (1.575 m)   Wt 183 lb (83 kg)   SpO2 97%   BMI 33.47 kg/m   Physical Exam  Constitutional: She is oriented to person, place, and time. She appears well-developed and well-nourished. No distress.  HENT:  Head: Normocephalic.  Nose: Nose normal.  Eyes: Conjunctivae are normal.  Neck: Neck supple. No tracheal deviation present.  Cardiovascular: Normal rate, regular rhythm and normal heart sounds.   Pulmonary/Chest: Effort normal and breath sounds normal. No respiratory distress.  Abdominal: Soft. She exhibits no distension. There is no tenderness. There is no rebound and no guarding.  Musculoskeletal:       Lumbar back: She exhibits tenderness. She exhibits no deformity and no spasm.       Back:  Neurological: She is alert and oriented to person, place, and time.  Skin: Skin is warm and dry.  Psychiatric: She has a normal mood and affect.  Vitals reviewed.    ED Treatments / Results  Labs (all labs ordered are listed, but only abnormal results are displayed) Labs Reviewed - No data to display  EKG  EKG Interpretation None       Radiology No results found.  Procedures Procedures (including critical care time)  Procedure Note: Trigger Point Injection for Myofascial pain  Performed by Dr. Clydene Pugh Indication: muscle/myofascial pain Muscle body and tendon sheath of the left lumbar paraspinal muscle(s) were injected with 0.5% bupivacaine under sterile technique for release of muscle spasm/pain. Patient tolerated well with immediate improvement of symptoms and no immediate complications following procedure.  CPT Code:   1 or 2 muscle bodies: 20552  Medications Ordered in ED Medications  ondansetron (ZOFRAN-ODT) disintegrating tablet 4 mg (4 mg Oral Given 11/04/16 1521)  bupivacaine (MARCAINE) 0.5 % injection 10 mL (10 mLs Infiltration Given 11/04/16 1519)     Initial Impression / Assessment and Plan / ED Course  I have reviewed the triage vital  signs and the nursing notes.  Pertinent labs & imaging results that were available during my care of the patient were reviewed by me and considered in my medical decision making (see chart for details).     61 y.o. female presents with Chronic left low back pain that has been exacerbated over the last few weeks. She ate a hotdog with lunch today which caused her to vomit 3 times. This has since resolved. I suspect this is food related and will be self-limited. She was given Zofran for her nausea and provided local anesthesia for the problem area in her back. I recommended follow-up with primary care physician as needed for ongoing or new concerning symptoms and return precautions were discussed.  Final Clinical Impressions(s) / ED Diagnoses   Final diagnoses:  Nausea and vomiting, intractability of vomiting not specified, unspecified vomiting type  Chronic left-sided low back pain without sciatica    New Prescriptions New Prescriptions   No medications on file     Lyndal Pulley, MD 11/04/16 1537

## 2016-11-04 NOTE — ED Triage Notes (Signed)
Per pt, states she started vomiting after eating lunch-vomited 3 times-has exacerbated her chronic back pain

## 2017-01-05 ENCOUNTER — Other Ambulatory Visit: Payer: Self-pay | Admitting: Family Medicine

## 2017-01-05 DIAGNOSIS — E785 Hyperlipidemia, unspecified: Secondary | ICD-10-CM

## 2017-01-14 ENCOUNTER — Encounter: Payer: Self-pay | Admitting: Family Medicine

## 2017-01-14 ENCOUNTER — Ambulatory Visit (INDEPENDENT_AMBULATORY_CARE_PROVIDER_SITE_OTHER): Payer: Self-pay | Admitting: Family Medicine

## 2017-01-14 VITALS — BP 120/79 | HR 88 | Temp 98.6°F | Resp 17 | Ht 61.5 in | Wt 177.0 lb

## 2017-01-14 DIAGNOSIS — G43009 Migraine without aura, not intractable, without status migrainosus: Secondary | ICD-10-CM | POA: Insufficient documentation

## 2017-01-14 DIAGNOSIS — Z5181 Encounter for therapeutic drug level monitoring: Secondary | ICD-10-CM

## 2017-01-14 DIAGNOSIS — H9313 Tinnitus, bilateral: Secondary | ICD-10-CM

## 2017-01-14 DIAGNOSIS — K219 Gastro-esophageal reflux disease without esophagitis: Secondary | ICD-10-CM

## 2017-01-14 DIAGNOSIS — J302 Other seasonal allergic rhinitis: Secondary | ICD-10-CM

## 2017-01-14 DIAGNOSIS — E785 Hyperlipidemia, unspecified: Secondary | ICD-10-CM

## 2017-01-14 DIAGNOSIS — I10 Essential (primary) hypertension: Secondary | ICD-10-CM

## 2017-01-14 DIAGNOSIS — E876 Hypokalemia: Secondary | ICD-10-CM

## 2017-01-14 DIAGNOSIS — J301 Allergic rhinitis due to pollen: Secondary | ICD-10-CM

## 2017-01-14 MED ORDER — OMEPRAZOLE 20 MG PO CPDR: 20.0000 mg | DELAYED_RELEASE_CAPSULE | Freq: Every day | ORAL | 3 refills | Status: DC

## 2017-01-14 MED ORDER — HYDROCHLOROTHIAZIDE 25 MG PO TABS: 25.0000 mg | ORAL_TABLET | Freq: Every day | ORAL | 3 refills | Status: DC

## 2017-01-14 MED ORDER — IBUPROFEN 600 MG PO TABS: 600.0000 mg | ORAL_TABLET | Freq: Three times a day (TID) | ORAL | 6 refills | Status: DC | PRN

## 2017-01-14 MED ORDER — FLUTICASONE PROPIONATE 50 MCG/ACT NA SUSP: 2.0000 | Freq: Every day | NASAL | 9 refills | Status: DC

## 2017-01-14 MED ORDER — PRAVASTATIN SODIUM 80 MG PO TABS: 80.0000 mg | ORAL_TABLET | Freq: Every day | ORAL | 3 refills | Status: DC

## 2017-01-14 MED ORDER — LORATADINE 10 MG PO TABS: 10.0000 mg | ORAL_TABLET | Freq: Every day | ORAL | 3 refills | Status: DC

## 2017-01-14 NOTE — Patient Instructions (Addendum)
Stop the [facility] Powder  For migraines try a dose of caffeine for your headaches and if that does not help then try ibuprofen  (1/2 tab) or 600 mg (1 tablet)  For back pain take  up to every 8 hours daily as needed.   IF you received an x-ray today, you will receive an invoice from [facility]. Please contact [facility] at [phone] with questions or concerns regarding your invoice.   IF you received labwork today, you will receive an invoice from [place]. Please contact [facility] at [phone] with questions or concerns regarding your invoice.   Our billing staff will not be able to assist you with questions regarding bills from these companies.  You will be contacted with the lab results as soon as they are available. The fastest way to get your results is to activate your My Chart account. Instructions are located on the last page of this paperwork. If you have not heard from [place] regarding the results in 2 weeks, please contact this office.    We recommend that you schedule a mammogram for breast cancer screening. Typically, you do not need a referral to do this. Please contact a local imaging center to schedule your mammogram.  [facility] - [phone]  *ask for the Radiology Department [facility]) - [phone] or [phone]  [facility] - [phone] [facility] - [phone] [facility] - [phone]  *ask for the Radiology Department [facility] - [phone]  *ask for the Radiology Department [facility] - [phone]  *ask for the Mammography Department [facility] - [phone] Migraine Headache A migraine headache is an intense, throbbing pain on one side or both sides of the head. Migraines may also cause other symptoms, such as nausea, vomiting, and sensitivity to light and noise. What are the causes? Doing or taking  certain things may also trigger migraines, such as:  Alcohol.  Smoking.  Medicines, such as:  Medicine used to treat chest pain (nitroglycerine).  Birth control pills.  Estrogen pills.  Certain blood pressure medicines.  Aged cheeses, chocolate, or caffeine.  Foods or drinks that contain nitrates, glutamate, aspartame, or tyramine.  Physical activity. Other things that may trigger a migraine include:  Menstruation.  Pregnancy.  Hunger.  Stress, lack of sleep, too much sleep, or fatigue.  Weather changes. What increases the risk? The following factors may make you more likely to experience migraine headaches:  Age. Risk increases with age.  Family history of migraine headaches.  Being Caucasian.  Depression and anxiety.  Obesity.  Being a woman.  Having a hole in the heart (patent foramen ovale) or other heart problems. What are the signs or symptoms? The main symptom of this condition is pulsating or throbbing pain. Pain may:  Happen in any area of the head, such as on one side or both sides.  Interfere with daily activities.  Get worse with physical activity.  Get worse with exposure to bright lights or loud noises. Other symptoms may include:  Nausea.  Vomiting.  Dizziness.  General sensitivity to bright lights, loud noises, or smells. Before you get a migraine, you may get warning signs that a migraine is developing (aura). An aura may include:  Seeing flashing lights or having blind spots.  Seeing bright spots, halos, or zigzag lines.  Having tunnel vision or blurred vision.  Having numbness or a tingling feeling.  Having trouble talking.  Having muscle weakness. How is this diagnosed? A migraine headache can be diagnosed based on:  Your symptoms.  A physical exam.  Tests, such as CT scan or MRI of the head. These imaging tests can help rule out other causes of headaches.  Taking fluid from the spine (lumbar puncture) and  analyzing it (cerebrospinal fluid analysis, or CSF analysis). How is this treated? A migraine headache is usually treated with medicines that:  Relieve pain.  Relieve nausea.  Prevent migraines from coming back. Treatment may also include:  Acupuncture.  Lifestyle changes like avoiding foods that trigger migraines. Follow these instructions at home: Medicines   Take over-the-counter and prescription medicines only as told by your health care provider.  Do not drive or use heavy machinery while taking prescription pain medicine.  To prevent or treat constipation while you are taking prescription pain medicine, your health care provider may recommend that you:  Drink enough fluid to keep your urine clear or pale yellow.  Take over-the-counter or prescription medicines.  Eat foods that are high in fiber, such as fresh fruits and vegetables, whole grains, and beans.  Limit foods that are high in fat and processed sugars, such as fried and sweet foods. Lifestyle   Avoid alcohol use.  Do not use any products that contain nicotine or tobacco, such as cigarettes and e-cigarettes. If you need help quitting, ask your health care provider.  Get at least 8 hours of sleep every night.  Limit your stress. General instructions    Keep a journal to find out what may trigger your migraine headaches. For example, write down:  What you eat and drink.  How much sleep you get.  Any change to your diet or medicines.  If you have a migraine:  Avoid things that make your symptoms worse, such as bright lights.  It may help to lie down in a dark, quiet room.  Do not drive or use heavy machinery.  Ask your health care provider what activities are safe for you while you are experiencing symptoms.  Keep all follow-up visits as told by your health care provider. This is important. Contact a health care provider if:  You develop symptoms that are different or more severe than your  usual migraine symptoms. Get help right away if:  Your migraine becomes severe.  You have a fever.  You have a stiff neck.  You have vision loss.  Your muscles feel weak or like you cannot control them.  You start to lose your balance often.  You develop trouble walking.  You faint. This information is not intended to replace advice given to you by your health care provider. Make sure you discuss any questions you have with your health care provider. Document Released: 09/02/2005 Document Revised: 03/22/2016 Document Reviewed: 02/19/2016 Elsevier Interactive Patient Education  2017 [employer].

## 2017-01-14 NOTE — Progress Notes (Signed)
Chief Complaint  Patient presents with  . Medication Refill    pravastatin, hctz, prilosec, loratadine, flonase    HPI   Hypertension: Patient here for follow-up of elevated blood pressure. She was lost to follow up  She is not exercising and is adherent to low salt diet.  Blood pressure is well controlled at home. Cardiac symptoms none. Patient denies chest pain, exertional chest pressure/discomfort, irregular heart beat and lower extremity edema.  Cardiovascular risk factors: dyslipidemia and hypertension. Use of agents associated with hypertension: none. History of target organ damage: none.  BP Readings from Last 3 Encounters:  01/14/17 120/79  11/04/16 148/97  02/22/16 134/86    Chronic tinnitus Eustachian tube dysfunction Migraines She reports that she has been to ENT in [place] in the past for eustacian tube dysfunction and reports that lately she has had worsening symptoms but was self medicating with BC powder She reports that she has been having headaches first thing in the morning She would take an aspirin to treat her headaches.  She states that this past year she has been having very severe tinnitus. She has been having intermittent migraines for 10 years.  She has never gotten treatment for her migraines.  She states that her migraines last 2-3 days.  She reports that last episode was in 2017.  She states that her migraines seem to be worsening with dizziness which is positional.  She takes antihistamine loratidine and flonase.   Hyperlipidemia: Patient presents with hyperlipidemia.  She was tested because of hypertension.  Her last labs see below. negative for chest pain, palpitations, shortness of breath. There is not a family history of hyperlipidemia. There is not a family history of early ischemia heart disease.  Lab Results  Component Value Date   CHOL 238 (H) 12/14/2014   CHOL 226 (H) 04/01/2014   CHOL 229 (H) 10/19/2013   Lab Results  Component Value  Date   HDL 134 12/14/2014   HDL 114 04/01/2014   HDL 108 10/19/2013   Lab Results  Component Value Date   LDLCALC 92 12/14/2014   LDLCALC 101 (H) 04/01/2014   LDLCALC 111 (H) 10/19/2013   Lab Results  Component Value Date   TRIG 58 12/14/2014   TRIG 54 04/01/2014   TRIG 51 10/19/2013   Lab Results  Component Value Date   CHOLHDL 1.8 12/14/2014   CHOLHDL 2.0 04/01/2014   CHOLHDL 2.1 10/19/2013   No results found for: LDLDIRECT  Past Medical History:  Diagnosis Date  . Allergy   . Cataract   . GERD (gastroesophageal reflux disease)   . Herniated disc, cervical   . Hypercholesteremia   . Hypertension     Current Outpatient Prescriptions  Medication Sig Dispense Refill  . Aspirin-Salicylamide-Caffeine (BC HEADACHE PO) Take 1 packet by mouth every 8 (eight) hours as needed. For headache    . fluticasone (FLONASE) 50 MCG/ACT nasal spray Place 2 sprays into both nostrils daily. 16 g 9  . hydrochlorothiazide (HYDRODIURIL) 25 MG tablet Take 1 tablet (25 mg total) by mouth daily. 90 tablet 3  . loratadine (CLARITIN) 10 MG tablet Take 1 tablet (10 mg total) by mouth daily. 90 tablet 3  . meclizine (ANTIVERT) 25 MG tablet Take 1 tablet (25 mg total) by mouth once. 30 tablet 0  . Multiple Vitamin (MULTIVITAMIN WITH MINERALS) TABS tablet Take 1 tablet by mouth daily.    Marland Kitchen omega-3 acid ethyl esters (LOVAZA) 1 g capsule Take 1 g by mouth daily.    Marland Kitchen  omeprazole (PRILOSEC) 20 MG capsule Take 1 capsule (20 mg total) by mouth daily. 90 capsule 3  . pravastatin (PRAVACHOL) 80 MG tablet Take 1 tablet (80 mg total) by mouth daily. 90 tablet 3  . vitamin C (ASCORBIC ACID) 500 MG tablet Take 500 mg by mouth daily.    Marland Kitchen ibuprofen (ADVIL,MOTRIN) 600 MG tablet Take 1 tablet (600 mg total) by mouth every 8 (eight) hours as needed. 30 tablet 6   No current facility-administered medications for this visit.     Allergies:  Allergies  Allergen Reactions  . Flagyl [Metronidazole] Rash    No  past surgical history on file.  Social History   Social History  . Marital status: Married    Spouse name: N/A  . Number of children: N/A  . Years of education: N/A   Social History Main Topics  . Smoking status: Never Smoker  . Smokeless tobacco: Former Neurosurgeon    Types: Snuff    Quit date: 01/15/2011  . Alcohol use No  . Drug use: No  . Sexual activity: Not Asked   Other Topics Concern  . None   Social History Narrative  . None    Review of Systems  Constitutional: Negative for chills, fever, malaise/fatigue and weight loss.  HENT: Positive for tinnitus. Negative for congestion, hearing loss and sinus pain.   Eyes: Negative for blurred vision and double vision.  Respiratory: Negative for cough, hemoptysis, sputum production, shortness of breath and wheezing.   Cardiovascular:       See hpi   Musculoskeletal: Negative for back pain and joint pain.  Skin: Negative for itching and rash.  Neurological: Negative for dizziness, tingling and headaches.  Psychiatric/Behavioral: Negative for depression. The patient is not nervous/anxious.    See hpi  Objective: Vitals:   01/14/17 1527  BP: 120/79  Pulse: 88  Resp: 17  Temp: 98.6 F (37 C)  TempSrc: Oral  SpO2: 95%  Weight: 177 lb (80.3 kg)  Height: 5' 1.5" (1.562 m)    Physical Exam  Constitutional: She is oriented to person, place, and time. She appears well-developed and well-nourished.  HENT:  Head: Normocephalic and atraumatic.  Right Ear: External ear normal.  Left Ear: External ear normal.  Eyes: Conjunctivae and EOM are normal.  Neck: Normal range of motion. Neck supple.  Cardiovascular: Normal rate, regular rhythm and normal heart sounds.   Pulmonary/Chest: Effort normal and breath sounds normal. No respiratory distress. She has no wheezes. She has no rales.  Neurological: She is alert and oriented to person, place, and time.  Skin: Skin is warm. Capillary refill takes less than 2 seconds. No erythema.    Psychiatric: She has a normal mood and affect. Her behavior is normal. Judgment and thought content normal.    Assessment and Plan Casey Peterson was seen today for medication refill.  Diagnoses and all orders for this visit:  Essential hypertension- bp stable on hctz monotherapy Will check labs -     Comprehensive metabolic panel -     Cancel: Microalbumin, urine -     hydrochlorothiazide (HYDRODIURIL) 25 MG tablet; Take 1 tablet (25 mg total) by mouth daily.  Dyslipidemia- discussed pravastatin Will check liver function test Discussed heart healthy diet -     Comprehensive metabolic panel -     Lipid panel  Encounter for medication monitoring -     Comprehensive metabolic panel -     Lipid panel -     Cancel: Microalbumin, urine  Seasonal  allergic rhinitis due to pollen- continue antihistamine -     loratadine (CLARITIN) 10 MG tablet; Take 1 tablet (10 mg total) by mouth daily.  Gastroesophageal reflux disease without esophagitis- stop aspirin, continue omeprazole  Other seasonal allergic rhinitis -     fluticasone (FLONASE) 50 MCG/ACT nasal spray; Place 2 sprays into both nostrils daily.  Tinnitus of both ears- discussed that aspirin causes tinnitus so advised to stop BC  Migraine without aura and without status migrainosus, not intractable- advised to try caffeine and ibuprofen for headaches  Other orders -     omeprazole (PRILOSEC) 20 MG capsule; Take 1 capsule (20 mg total) by mouth daily. -     pravastatin (PRAVACHOL) 80 MG tablet; Take 1 tablet (80 mg total) by mouth daily. -     ibuprofen (ADVIL,MOTRIN) 600 MG tablet; Take 1 tablet (600 mg total) by mouth every 8 (eight) hours as needed.  Gave information on charity care and also sent information on coupons for medications   Casey Peterson A Creta Levin

## 2017-01-15 LAB — COMPREHENSIVE METABOLIC PANEL
ALK PHOS: 52 IU/L (ref 39–117)
ALT: 34 IU/L — ABNORMAL HIGH (ref 0–32)
AST: 37 IU/L (ref 0–40)
Albumin/Globulin Ratio: 1.5 (ref 1.2–2.2)
Albumin: 4.5 g/dL (ref 3.6–4.8)
BUN / CREAT RATIO: 22 (ref 12–28)
BUN: 19 mg/dL (ref 8–27)
Bilirubin Total: <0.2 mg/dL (ref 0.0–1.2)
CALCIUM: 9.8 mg/dL (ref 8.7–10.3)
CO2: 26 mmol/L (ref 18–29)
CREATININE: 0.87 mg/dL (ref 0.57–1.00)
Chloride: 104 mmol/L (ref 96–106)
GFR calc non Af Amer: 72 mL/min/{1.73_m2} (ref >59)
GFR, EST AFRICAN AMERICAN: 83 mL/min/{1.73_m2} (ref >59)
GLOBULIN, TOTAL: 3 g/dL (ref 1.5–4.5)
GLUCOSE: 124 mg/dL — AB (ref 65–99)
Potassium: 3.1 mmol/L — ABNORMAL LOW (ref 3.5–5.2)
SODIUM: 149 mmol/L — AB (ref 134–144)
TOTAL PROTEIN: 7.5 g/dL (ref 6.0–8.5)

## 2017-01-15 LAB — LIPID PANEL
CHOL/HDL RATIO: 1.9 ratio (ref 0.0–4.4)
Cholesterol, Total: 210 mg/dL — ABNORMAL HIGH (ref 100–199)
HDL: 112 mg/dL (ref >39)
LDL CALC: 85 mg/dL (ref 0–99)
Triglycerides: 64 mg/dL (ref 0–149)
VLDL Cholesterol Cal: 13 mg/dL (ref 5–40)

## 2017-01-27 NOTE — Addendum Note (Signed)
Addended by: Collie SiadSTALLINGS, Donzell Coller A on: 01/27/2017 12:16 PM   Modules accepted: Orders

## 2017-06-16 ENCOUNTER — Inpatient Hospital Stay (HOSPITAL_COMMUNITY)
Admission: EM | Admit: 2017-06-16 | Discharge: 2017-06-17 | DRG: 282 | Disposition: A | Discharge status: Home / Self Care | Payer: Self-pay | Attending: Cardiovascular Disease | Admitting: Cardiovascular Disease

## 2017-06-16 ENCOUNTER — Emergency Department (HOSPITAL_COMMUNITY): Payer: Self-pay

## 2017-06-16 ENCOUNTER — Encounter (HOSPITAL_COMMUNITY): Payer: Self-pay

## 2017-06-16 ENCOUNTER — Other Ambulatory Visit: Payer: Self-pay

## 2017-06-16 DIAGNOSIS — Z8249 Family history of ischemic heart disease and other diseases of the circulatory system: Secondary | ICD-10-CM

## 2017-06-16 DIAGNOSIS — Z79899 Other long term (current) drug therapy: Secondary | ICD-10-CM

## 2017-06-16 DIAGNOSIS — I252 Old myocardial infarction: Secondary | ICD-10-CM | POA: Diagnosis present

## 2017-06-16 DIAGNOSIS — E876 Hypokalemia: Secondary | ICD-10-CM | POA: Diagnosis present

## 2017-06-16 DIAGNOSIS — Z23 Encounter for immunization: Secondary | ICD-10-CM

## 2017-06-16 DIAGNOSIS — Z87891 Personal history of nicotine dependence: Secondary | ICD-10-CM

## 2017-06-16 DIAGNOSIS — I10 Essential (primary) hypertension: Secondary | ICD-10-CM | POA: Diagnosis present

## 2017-06-16 DIAGNOSIS — I214 Non-ST elevation (NSTEMI) myocardial infarction: Principal | ICD-10-CM | POA: Diagnosis present

## 2017-06-16 DIAGNOSIS — E7800 Pure hypercholesterolemia, unspecified: Secondary | ICD-10-CM | POA: Diagnosis present

## 2017-06-16 DIAGNOSIS — K219 Gastro-esophageal reflux disease without esophagitis: Secondary | ICD-10-CM | POA: Diagnosis present

## 2017-06-16 DIAGNOSIS — E785 Hyperlipidemia, unspecified: Secondary | ICD-10-CM | POA: Diagnosis present

## 2017-06-16 DIAGNOSIS — I200 Unstable angina: Secondary | ICD-10-CM | POA: Diagnosis present

## 2017-06-16 HISTORY — DX: Headache, unspecified: R51.9

## 2017-06-16 HISTORY — DX: Atherosclerotic heart disease of native coronary artery without angina pectoris: I25.10

## 2017-06-16 HISTORY — DX: Headache: R51

## 2017-06-16 LAB — LIPASE, BLOOD: LIPASE: 31 U/L (ref 11–51)

## 2017-06-16 LAB — PROTIME-INR
INR: 0.91
PROTHROMBIN TIME: 12.2 s (ref 11.4–15.2)

## 2017-06-16 LAB — HEPATIC FUNCTION PANEL
ALBUMIN: 4.4 g/dL (ref 3.5–5.0)
ALT: 35 U/L (ref 14–54)
AST: 33 U/L (ref 15–41)
Alkaline Phosphatase: 53 U/L (ref 38–126)
Bilirubin, Direct: <0.1 mg/dL — ABNORMAL LOW (ref 0.1–0.5)
TOTAL PROTEIN: 8.4 g/dL — AB (ref 6.5–8.1)
Total Bilirubin: 0.3 mg/dL (ref 0.3–1.2)

## 2017-06-16 LAB — POCT I-STAT TROPONIN I
TROPONIN I, POC: 0.01 ng/mL (ref 0.00–0.08)
TROPONIN I, POC: 0.12 ng/mL — AB (ref 0.00–0.08)

## 2017-06-16 LAB — BASIC METABOLIC PANEL
Anion gap: 9 (ref 5–15)
BUN: 17 mg/dL (ref 6–20)
CALCIUM: 9.9 mg/dL (ref 8.9–10.3)
CO2: 29 mmol/L (ref 22–32)
CREATININE: 0.84 mg/dL (ref 0.44–1.00)
Chloride: 103 mmol/L (ref 101–111)
GFR calc Af Amer: >60 mL/min (ref >60)
GFR calc non Af Amer: >60 mL/min (ref >60)
Glucose, Bld: 121 mg/dL — ABNORMAL HIGH (ref 65–99)
POTASSIUM: 2.9 mmol/L — AB (ref 3.5–5.1)
Sodium: 141 mmol/L (ref 135–145)

## 2017-06-16 LAB — CBC
HCT: 38.5 % (ref 36.0–46.0)
HEMOGLOBIN: 13.2 g/dL (ref 12.0–15.0)
MCH: 28.6 pg (ref 26.0–34.0)
MCHC: 34.3 g/dL (ref 30.0–36.0)
MCV: 83.3 fL (ref 78.0–100.0)
PLATELETS: 283 10*3/uL (ref 150–400)
RBC: 4.62 MIL/uL (ref 3.87–5.11)
RDW: 14 % (ref 11.5–15.5)
WBC: 6.3 10*3/uL (ref 4.0–10.5)

## 2017-06-16 MED ORDER — ASPIRIN 325 MG PO TABS
325.0000 mg | ORAL_TABLET | Freq: Once | ORAL | Status: AC
  Administered 2017-06-16: 325 mg via ORAL
  Filled 2017-06-16: qty 1

## 2017-06-16 MED ORDER — ATORVASTATIN CALCIUM 80 MG PO TABS: 80.0000 mg | ORAL_TABLET | Freq: Every day | ORAL | Status: DC

## 2017-06-16 MED ORDER — HEPARIN BOLUS VIA INFUSION
4000.0000 [IU] | Freq: Once | INTRAVENOUS | Status: AC
  Administered 2017-06-16: 4000 [IU] via INTRAVENOUS
  Filled 2017-06-16: qty 4000

## 2017-06-16 MED ORDER — NITROGLYCERIN 0.4 MG SL SUBL: 0.4000 mg | SUBLINGUAL_TABLET | SUBLINGUAL | Status: DC | PRN

## 2017-06-16 MED ORDER — HEPARIN (PORCINE) IN NACL 100-0.45 UNIT/ML-% IJ SOLN
850.0000 [IU]/h | INTRAMUSCULAR | Status: DC
  Administered 2017-06-16: 850 [IU]/h via INTRAVENOUS
  Filled 2017-06-16: qty 250

## 2017-06-16 MED ORDER — SODIUM CHLORIDE 0.9% FLUSH: 3.0000 mL | Freq: Two times a day (BID) | INTRAVENOUS | Status: DC

## 2017-06-16 MED ORDER — ASPIRIN EC 81 MG PO TBEC
81.0000 mg | DELAYED_RELEASE_TABLET | Freq: Every day | ORAL | Status: DC
  Filled 2017-06-16: qty 1

## 2017-06-16 MED ORDER — SODIUM CHLORIDE 0.9 % IV BOLUS (SEPSIS)
1000.0000 mL | Freq: Once | INTRAVENOUS | Status: AC
  Administered 2017-06-16: 1000 mL via INTRAVENOUS

## 2017-06-16 MED ORDER — GI COCKTAIL ~~LOC~~
30.0000 mL | Freq: Once | ORAL | Status: AC
  Administered 2017-06-16: 30 mL via ORAL
  Filled 2017-06-16: qty 30

## 2017-06-16 MED ORDER — POTASSIUM CHLORIDE CRYS ER 20 MEQ PO TBCR
40.0000 meq | EXTENDED_RELEASE_TABLET | Freq: Once | ORAL | Status: AC
  Administered 2017-06-16: 40 meq via ORAL
  Filled 2017-06-16: qty 2

## 2017-06-16 MED ORDER — SODIUM CHLORIDE 0.9 % WEIGHT BASED INFUSION
3.0000 mL/kg/h | INTRAVENOUS | Status: DC
  Administered 2017-06-17: 3 mL/kg/h via INTRAVENOUS

## 2017-06-16 MED ORDER — FAMOTIDINE IN NACL 20-0.9 MG/50ML-% IV SOLN
20.0000 mg | Freq: Once | INTRAVENOUS | Status: AC
  Administered 2017-06-16: 20 mg via INTRAVENOUS
  Filled 2017-06-16: qty 50

## 2017-06-16 MED ORDER — HYDRALAZINE HCL 20 MG/ML IJ SOLN: 10.0000 mg | INTRAMUSCULAR | Status: DC | PRN

## 2017-06-16 MED ORDER — HEPARIN (PORCINE) IN NACL 100-0.45 UNIT/ML-% IJ SOLN: 10.0000 [IU]/kg/h | Freq: Once | INTRAMUSCULAR | Status: DC

## 2017-06-16 MED ORDER — SODIUM CHLORIDE 0.9% FLUSH
3.0000 mL | INTRAVENOUS | Status: DC | PRN
Start: 2017-06-16 — End: 2017-06-17

## 2017-06-16 MED ORDER — POTASSIUM CHLORIDE 10 MEQ/100ML IV SOLN
10.0000 meq | Freq: Once | INTRAVENOUS | Status: AC
  Administered 2017-06-16: 10 meq via INTRAVENOUS
  Filled 2017-06-16: qty 100

## 2017-06-16 MED ORDER — SODIUM CHLORIDE 0.9 % WEIGHT BASED INFUSION: 1.0000 mL/kg/h | INTRAVENOUS | Status: DC

## 2017-06-16 MED ORDER — SODIUM CHLORIDE 0.9 % IV SOLN: 250.0000 mL | INTRAVENOUS | Status: DC | PRN

## 2017-06-16 MED ORDER — ONDANSETRON HCL 4 MG/2ML IJ SOLN: 4.0000 mg | Freq: Four times a day (QID) | INTRAMUSCULAR | Status: DC | PRN

## 2017-06-16 MED ORDER — ACETAMINOPHEN 325 MG PO TABS
650.0000 mg | ORAL_TABLET | ORAL | Status: DC | PRN
  Administered 2017-06-17: 650 mg via ORAL
  Filled 2017-06-16: qty 2

## 2017-06-16 MED ORDER — INFLUENZA VAC SPLIT QUAD 0.5 ML IM SUSY
0.5000 mL | PREFILLED_SYRINGE | Freq: Once | INTRAMUSCULAR | Status: AC
  Administered 2017-06-17: 0.5 mL via INTRAMUSCULAR
  Filled 2017-06-16: qty 0.5

## 2017-06-16 MED ORDER — PANTOPRAZOLE SODIUM 40 MG PO TBEC
40.0000 mg | DELAYED_RELEASE_TABLET | Freq: Every day | ORAL | Status: DC
  Administered 2017-06-17: 40 mg via ORAL
  Filled 2017-06-16 (×2): qty 1

## 2017-06-16 MED ORDER — ASPIRIN 81 MG PO CHEW
81.0000 mg | CHEWABLE_TABLET | ORAL | Status: AC
  Administered 2017-06-17: 81 mg via ORAL
  Filled 2017-06-16: qty 1

## 2017-06-16 NOTE — H&P (Signed)
Cardiology Admission History and Physical:   Patient ID: Casey Peterson; MRN: [id number]; DOB: 05/10/1956   Admission date: 06/16/2017  Primary Care Provider: Lenell Antu, DO Primary Cardiologist: New (Dr. Royann Shivers)   Chief Complaint:  Chest Pain and Exertional Dyspnea  Patient Profile:   Casey Peterson is a 61 y.o. female with a history of HTN, HLD and GERD, presenting to ED with complaint of chest pain and exertional dyspnea. POC troponin abnormal at 0.12.   History of Present Illness:   Pt denies any prior cardiac history. She has multiple cardiac risk factors including HTN and HLD. She notes family h/o heart disease. Her sister has a PPM? Defibrillator, which she notes was due to slow heart rate. Pt denies h/o DM. She is a former smoker but quit 30 years ago. She works in the Editor, commissioning at [employer].   She notes that for the past 1-2 weeks she has had intermitent exertional dyspnea and substernal chest pain, walking up inclines, which is new for her. Symptoms would improve with rest. Earlier this morning, while getting ready for work, she developed symptoms at rest. She notes substernal chest pressure/ burning radiating up into her throat. She notes she has a h/o acid reflux, however this was much different than her usual reflux. Prior to onset of symptoms, she ate a peanut butter crackers and had almond milk for breakfast. Unlike her recent exertional CP, this episode occurred at rest and was prolonged and did not resolve spontaneously. Subsequently, she drove herself to the ED.   POC troponin is abnormal at 0.12.  EKG shows SR with borderline inferior TW abnormalities. CBC unremarkable. BMP notable for hypokalemia with K at 2.9. She denies any recent n/v/d. She takes HCTZ daily for HTN. She notes poor PO intake recently (not much of an appetite). BP mildly elevated at 144/99. CXR negative. ED MD has initiated IV heparin per pharmacy given elevated initial troponin. She is also getting IV K.  She is currently CP free in ED.   Past Medical History:  Diagnosis Date  . Allergy   . Cataract   . GERD (gastroesophageal reflux disease)   . Herniated disc, cervical   . Hypercholesteremia   . Hypertension     History reviewed. No pertinent surgical history.   Medications Prior to Admission: Prior to Admission medications   Medication Sig Start Date End Date Taking? Authorizing Provider  Aspirin-Salicylamide-Caffeine (BC HEADACHE PO) Take 1 packet by mouth every 8 (eight) hours as needed. For headache   Yes [provider]  Flaxseed, Linseed, (FLAXSEED OIL PO) Take 1 capsule by mouth daily.   Yes [provider]  fluticasone (FLONASE) 50 MCG/ACT nasal spray Place 2 sprays into both nostrils daily. Patient taking differently: Place 2 sprays into both nostrils daily as needed for allergies.  01/14/17  Yes Stallings, Zoe A, MD  hydrochlorothiazide (HYDRODIURIL) 25 MG tablet Take 1 tablet (25 mg total) by mouth daily. 01/14/17  Yes Collie Siad A, MD  ibuprofen (ADVIL,MOTRIN) 600 MG tablet Take 1 tablet (600 mg total) by mouth every 8 (eight) hours as needed. Patient taking differently: Take 600 mg by mouth every 8 (eight) hours as needed for moderate pain.  01/14/17  Yes Collie Siad A, MD  loratadine (CLARITIN) 10 MG tablet Take 1 tablet (10 mg total) by mouth daily. Patient taking differently: Take 10 mg by mouth daily as needed for allergies.  01/14/17  Yes Doristine Bosworth, MD  Multiple Vitamin (MULTIVITAMIN WITH MINERALS)  TABS tablet Take 1 tablet by mouth daily.   Yes [provider]  omeprazole (PRILOSEC) 20 MG capsule Take 1 capsule (20 mg total) by mouth daily. 01/14/17  Yes Collie Siad A, MD  pravastatin (PRAVACHOL) 80 MG tablet Take 1 tablet (80 mg total) by mouth daily. 01/14/17  Yes Doristine Bosworth, MD  vitamin C (ASCORBIC ACID) 500 MG tablet Take 500 mg by mouth daily.   Yes [provider]  meclizine (ANTIVERT) 25 MG tablet Take 1 tablet (25  mg total) by mouth once. Patient not taking: Reported on 06/16/2017 02/23/16   Jerre Simon, PA     Allergies:    Allergies  Allergen Reactions  . Flagyl [Metronidazole] Rash    Social History:   Social History   Social History  . Marital status: Married    Spouse name: N/A  . Number of children: N/A  . Years of education: N/A   Occupational History  . Not on file.   Social History Main Topics  . Smoking status: Never Smoker  . Smokeless tobacco: Former Neurosurgeon    Types: Snuff    Quit date: 01/15/2011  . Alcohol use No  . Drug use: No  . Sexual activity: Not on file   Other Topics Concern  . Not on file   Social History Narrative  . No narrative on file    Family History:   The patient's family history includes Cancer in her mother; Diabetes in her mother, sister, and sister; Heart disease in her mother; Hyperlipidemia in her mother and sister; Hypertension in her father and mother; Stroke in her father, mother, and sister.    ROS:  Please see the history of present illness.  All other ROS reviewed and negative.     Physical Exam/Data:   Vitals:   06/16/17 0822 06/16/17 0839 06/16/17 1430 06/16/17 1551  BP: (!) 144/99  (!) 160/91 140/85  Pulse: 79  64 67  Resp: 17  13 (!) 21  Temp: 98.3 F (36.8 C)     SpO2: 95%  100% 99%  Weight:  183 lb (83 kg)    Height:   (1.575 m)      Intake/Output Summary (Last 24 hours) at 06/16/17 1612 Last data filed at 06/16/17 1551  Gross per 24 hour  Intake             1050 ml  Output                0 ml  Net             1050 ml   Filed Weights   06/16/17 0839  Weight: 183 lb (83 kg)   Body mass index is 33.47 kg/m.  General:  Well nourished, well developed, in no acute distress HEENT: normal Lymph: no adenopathy Neck: no JVD Endocrine:  No thryomegaly Vascular: No carotid bruits; FA pulses 2+ bilaterally without bruits  Cardiac:  normal S1, S2; RRR; no murmur Lungs:  clear to auscultation bilaterally, no  wheezing, rhonchi or rales  Abd: soft, nontender, no hepatomegaly  Ext: no edema Musculoskeletal:  No deformities, BUE and BLE strength normal and equal Skin: warm and dry  Neuro:  CNs 2-12 intact, no focal abnormalities noted Psych:  Normal affect    EKG:  The EKG shows  NSR with borderline inferior TW abnormalities-- personally reviewed   Relevant CV Studies: None   Laboratory Data:  Chemistry Recent Labs Lab 06/16/17 0850  NA 141  K 2.9*  CL 103  CO2 29  GLUCOSE 121*  BUN 17  CREATININE 0.84  CALCIUM 9.9  GFRNONAA >60  GFRAA >60  ANIONGAP 9     Recent Labs Lab 06/16/17 1442  PROT 8.4*  ALBUMIN 4.4  AST 33  ALT 35  ALKPHOS 53  BILITOT 0.3   Hematology Recent Labs Lab 06/16/17 0850  WBC 6.3  RBC 4.62  HGB 13.2  HCT 38.5  MCV 83.3  MCH 28.6  MCHC 34.3  RDW 14.0  PLT 283   Cardiac EnzymesNo results for input(s): TROPONINI in the last 168 hours.  Recent Labs Lab 06/16/17 0859 06/16/17 1501  TROPIPOC 0.01 0.12*    BNPNo results for input(s): BNP, PROBNP in the last 168 hours.  DDimer No results for input(s): DDIMER in the last 168 hours.  Radiology/Studies:  Dg Chest 2 View  Result Date: 06/16/2017 CLINICAL DATA:  Chest pain. EXAM: CHEST  2 VIEW COMPARISON:  None. FINDINGS: The heart size and mediastinal contours are within normal limits. Both lungs are clear. Degenerative changes of the thoracolumbar spine. No acute osseous abnormality. IMPRESSION: No active cardiopulmonary disease. Electronically Signed   By: Obie Dredge M.D.   On: 06/16/2017 09:06    Assessment and Plan:   1. ACS: pt with recent symptoms of unstable angina, with new development of exertional CP and dyspnea, progressing to resting symptoms. POC troponin in the ED mildly abnormal at 0.12. IV heparin has been initiated in the ED. Pt is currently CP free. Given her symptomatolgy and initial troponin reading, she will need to be admitted for definitive [facility] +/- PCI. Plan to  admit to [facility]. Continue IV heparin. Continue to cycle cardiac enzymes x 3 to assess trend. If CP returns, can start IV nitro. Will start on ASA 81 mg and Lipitor 80 mg. Check FLP in the am for baseline assessment of LDL. Given baseline resting HR in the low 60s, we will hold off on initiation of BB at this time. We will order 2D echo to assess LVF and screen for structural abnormalities. Check Hgb A1c to screen for DM.   2. Hypokalemia: K is 2.9 in ED. Pt denies n/v/d but takes HCTZ for HTN. She notes poor PO intake recently given loss of appetite She is getting supplemental IV K in the ED. We will check Mg level and supplement if deficient. F/u BMP in the am. Hold HCTZ for now.   3. HTN: BP is mild-moderately elevated in the ED. No room in HR to add BB. We will avoid further use of HCTZ at this time given hypokalemia. We can add low dose ARB and can also use PRN IV hydralazine as needed.   Severity of Illness: The appropriate patient status for this patient is INPATIENT. Inpatient status is judged to be reasonable and necessary in order to provide the required intensity of service to ensure the patient's safety. The patient's presenting symptoms, physical exam findings, and initial radiographic and laboratory data in the context of their chronic comorbidities is felt to place them at high risk for further clinical deterioration. Furthermore, it is not anticipated that the patient will be medically stable for discharge from the hospital within 2 midnights of admission. The following factors support the patient status of inpatient.   " The patient's presenting symptoms include unstable angina. " The worrisome physical exam findings include, elevated BP " The initial radiographic and laboratory data are worrisome because of + troponin level. " The chronic co-morbidities  include HTN and HLD.   * I certify that at the point of admission it is my clinical judgment that the patient will require inpatient  hospital care spanning beyond 2 midnights from the point of admission due to high intensity of service, high risk for further deterioration and high frequency of surveillance required.*    For questions or updates, please contact [facility] Please consult [email or web address] for contact info under Cardiology/STEMI.  MD to follow with further recommendations.     Signed, Robbie Lis, PA-C  06/16/2017 4:12 PM   I have seen and examined the patient along with Robbie Lis, PA-C .  I have reviewed the chart, notes and new data.  I agree with PA's note.  Key new complaints: Describes fairly typical crescendo angina pectoris/unstable angina. Numerous family members have CAD or PAD with revascularization procedures. Key examination changes: normal cardiovascular exam Key new findings / data: QS pattern in leads V1-V2 with very subtle ST segment deviation, nonspecific; abnormal cardiac troponin I consistent with very small non-STEMI.  PLAN: Unstable angina/small non-STEMI. Coronary angiography and possible percutaneous revascularization with angioplasty/stent tomorrow. Scheduled with Dr. Herbie Baltimore at noon. This procedure has been fully reviewed with the patient and written informed consent has been obtained. On intravenous heparin overnight. High-dose statin.  Thurmon Fair, MD, [facility] [phone] 06/16/2017, 5:54 PM

## 2017-06-16 NOTE — ED Provider Notes (Signed)
[facility]-EMERGENCY DEPT Provider Note   CSN: [id number] Arrival date & time: 06/16/17  0813     History   Chief Complaint Chief Complaint  Patient presents with  . Chest Pain    HPI Casey Peterson is a 61 y.o. female history of reflux, HL, hypertension here presenting with shortness of breath with exertion, chest pressure. Patient states that she has been having shortness of breath with exertion over the last week or so. Today she ate some homemade peanut butter and crackers at home and milk and had some nausea and epigastric pain and chest pressure. She denies anything being stuck n her throat. She came to the ED around 9 AM and her initial troponin was negative and she states that her pain has resolved now. She denies any recent travel or history of blood clots or cardiac stents.  The history is provided by the patient.    Past Medical History:  Diagnosis Date  . Allergy   . Cataract   . GERD (gastroesophageal reflux disease)   . Herniated disc, cervical   . Hypercholesteremia   . Hypertension     Patient Active Problem List   Diagnosis Date Noted  . Tinnitus of both ears 01/14/2017  . Migraine without aura and without status migrainosus, not intractable 01/14/2017  . Gastroesophageal reflux disease without esophagitis 01/14/2017  . HTN (hypertension) 12/14/2014  . Hyperlipidemia 12/14/2014    History reviewed. No pertinent surgical history.  OB History    No data available       Home Medications    Prior to Admission medications   Medication Sig Start Date End Date Taking? Authorizing Provider  Aspirin-Salicylamide-Caffeine (BC HEADACHE PO) Take 1 packet by mouth every 8 (eight) hours as needed. For headache   Yes [provider]  Flaxseed, Linseed, (FLAXSEED OIL PO) Take 1 capsule by mouth daily.   Yes [provider]  fluticasone (FLONASE) 50 MCG/ACT nasal spray Place 2 sprays into both nostrils daily. Patient taking differently: Place 2 sprays into  both nostrils daily as needed for allergies.  01/14/17  Yes Stallings, Zoe A, MD  hydrochlorothiazide (HYDRODIURIL) 25 MG tablet Take 1 tablet (25 mg total) by mouth daily. 01/14/17  Yes Collie Siad A, MD  ibuprofen (ADVIL,MOTRIN) 600 MG tablet Take 1 tablet (600 mg total) by mouth every 8 (eight) hours as needed. Patient taking differently: Take 600 mg by mouth every 8 (eight) hours as needed for moderate pain.  01/14/17  Yes Collie Siad A, MD  loratadine (CLARITIN) 10 MG tablet Take 1 tablet (10 mg total) by mouth daily. Patient taking differently: Take 10 mg by mouth daily as needed for allergies.  01/14/17  Yes Doristine Bosworth, MD  Multiple Vitamin (MULTIVITAMIN WITH MINERALS) TABS tablet Take 1 tablet by mouth daily.   Yes [provider]  omeprazole (PRILOSEC) 20 MG capsule Take 1 capsule (20 mg total) by mouth daily. 01/14/17  Yes Collie Siad A, MD  pravastatin (PRAVACHOL) 80 MG tablet Take 1 tablet (80 mg total) by mouth daily. 01/14/17  Yes Doristine Bosworth, MD  vitamin C (ASCORBIC ACID) 500 MG tablet Take 500 mg by mouth daily.   Yes [provider]  meclizine (ANTIVERT) 25 MG tablet Take 1 tablet (25 mg total) by mouth once. Patient not taking: Reported on 06/16/2017 02/23/16   Jerre Simon, PA    Family History Family History  Problem Relation Age of Onset  . Cancer Mother   . Diabetes Mother   .  Heart disease Mother   . Hyperlipidemia Mother   . Hypertension Mother   . Stroke Mother   . Stroke Father   . Hypertension Father   . Hyperlipidemia Sister   . Stroke Sister   . Diabetes Sister   . Diabetes Sister     Social History Social History  Substance Use Topics  . Smoking status: Never Smoker  . Smokeless tobacco: Former Neurosurgeon    Types: Snuff    Quit date: 01/15/2011  . Alcohol use No     Allergies   Flagyl [metronidazole]   Review of Systems Review of Systems  Cardiovascular: Positive for chest pain.  All other systems reviewed and are  negative.    Physical Exam Updated Vital Signs BP (!) 144/99   Pulse 79   Temp 98.3 F (36.8 C)   Resp 17   Ht  (1.575 m)   Wt 83 kg (183 lb)   SpO2 95%   BMI 33.47 kg/m   Physical Exam  Constitutional: She is oriented to person, place, and time. She appears well-developed.  HENT:  Head: Normocephalic.  Mouth/Throat: Oropharynx is clear and moist.  Eyes: Pupils are equal, round, and reactive to light. Conjunctivae and EOM are normal.  Neck: Normal range of motion. Neck supple.  Cardiovascular: Normal rate, regular rhythm and normal heart sounds.   Pulmonary/Chest: Effort normal and breath sounds normal. No respiratory distress. She has no wheezes. She has no rales.  Abdominal: Soft. Bowel sounds are normal. She exhibits no distension. There is no tenderness. There is no guarding.  Musculoskeletal: Normal range of motion. She exhibits no edema or deformity.  Neurological: She is alert and oriented to person, place, and time. No cranial nerve deficit. Coordination normal.  Skin: Skin is warm.  Psychiatric: She has a normal mood and affect.  Nursing note and vitals reviewed.    ED Treatments / Results  Labs (all labs ordered are listed, but only abnormal results are displayed) Labs Reviewed  BASIC METABOLIC PANEL - Abnormal; Notable for the following:       Result Value   Potassium 2.9 (*)    Glucose, Bld 121 (*)    All other components within normal limits  POCT I-STAT TROPONIN I - Abnormal; Notable for the following:    Troponin i, poc 0.12 (*)    All other components within normal limits  CBC  LIPASE, BLOOD  HEPATIC FUNCTION PANEL  PROTIME-INR  I-STAT TROPONIN, ED  POCT I-STAT TROPONIN I  I-STAT TROPONIN, ED    EKG  EKG Interpretation  Date/Time:  Monday June 16 2017 15:13:48 EDT Ventricular Rate:  66 PR Interval:    QRS Duration: 104 QT Interval:  443 QTC Calculation: 465 R Axis:   31 Text Interpretation:  Sinus rhythm Probable anteroseptal  infarct, old No significant change since last tracing earlier today Confirmed by Richardean Canal ([id number]) on 06/16/2017 3:17:22 PM       Radiology Dg Chest 2 View  Result Date: 06/16/2017 CLINICAL DATA:  Chest pain. EXAM: CHEST  2 VIEW COMPARISON:  None. FINDINGS: The heart size and mediastinal contours are within normal limits. Both lungs are clear. Degenerative changes of the thoracolumbar spine. No acute osseous abnormality. IMPRESSION: No active cardiopulmonary disease. Electronically Signed   By: Obie Dredge M.D.   On: 06/16/2017 09:06    Procedures Procedures (including critical care time)  CRITICAL CARE Performed by: Richardean Canal   Total critical care time: 30 minutes  Critical  care time was exclusive of separately billable procedures and treating other patients.  Critical care was necessary to treat or prevent imminent or life-threatening deterioration.  Critical care was time spent personally by me on the following activities: development of treatment plan with patient and/or surrogate as well as nursing, discussions with consultants, evaluation of patient's response to treatment, examination of patient, obtaining history from patient or surrogate, ordering and performing treatments and interventions, ordering and review of laboratory studies, ordering and review of radiographic studies, pulse oximetry and re-evaluation of patient's condition.   Medications Ordered in ED Medications  potassium chloride SA (K-DUR,KLOR-CON) CR tablet 40 mEq (not administered)  potassium chloride 10 mEq in 100 mL IVPB (not administered)  aspirin tablet 325 mg (not administered)  heparin bolus via infusion 4,000 Units (not administered)  heparin ADULT infusion 100 units/mL (25000 units/289mL sodium chloride 0.45%) (not administered)  sodium chloride 0.9 % bolus 1,000 mL (1,000 mLs Intravenous New Bag/Given 06/16/17 1458)  famotidine (PEPCID) IVPB 20 mg premix (0 mg Intravenous Stopped 06/16/17  1528)  gi cocktail (Maalox,Lidocaine,Donnatal) (30 mLs Oral Given 06/16/17 1449)     Initial Impression / Assessment and Plan / ED Course  I have reviewed the triage vital signs and the nursing notes.  Pertinent labs & imaging results that were available during my care of the patient were reviewed by me and considered in my medical decision making (see chart for details).     Ayan Yankey is a 61 y.o. female here with epigastric pain, shortness of breath with exertion. Consider ACS vs reflux. I doubt dissection or PE. Will get labs, trop x 2, CXR. Will give pepcid, GI cocktail.   3:35 PM Delta trop 0.12. Repeat EKG showed ST depressions inferior leads, unchanged from earlier in the day. She had no previous to compared to before today so unclear if the TWI is new or not. Pain free currently. I ordered ASA, heparin drip. K 2.9, supplemented. I consulted cardiology to admit for unstable angina vs NSTEMI.    Final Clinical Impressions(s) / ED Diagnoses   Final diagnoses:  None    New Prescriptions New Prescriptions   No medications on file     Charlynne Pander, MD 06/16/17 1536

## 2017-06-16 NOTE — ED Triage Notes (Signed)
Patient c/o having pain from throat to epigastric area. Patient states a history of reflux disease. Patient states she ate peanut butter crackers and almond milk this morning.

## 2017-06-16 NOTE — ED Notes (Signed)
Abnormal lab result MD Yao have been made aware 

## 2017-06-16 NOTE — Progress Notes (Signed)
ANTICOAGULATION CONSULT NOTE - Initial Consult  Pharmacy Consult for IV heparin Indication: chest pain/ACS  Allergies  Allergen Reactions  . Flagyl [Metronidazole] Rash    Patient Measurements: Height:  (157.5 cm) Weight: 183 lb (83 kg) IBW/kg (Calculated) : 50.1 Heparin Dosing Weight: 68.7 kg  Vital Signs: Temp: 98.3 F (36.8 C) (10/01 0822) BP: 144/99 (10/01 0822) Pulse Rate: 79 (10/01 0822)  Labs:  Recent Labs  06/16/17 0850  HGB 13.2  HCT 38.5  PLT 283  CREATININE 0.84    Estimated Creatinine Clearance: 69.4 mL/min (by C-G formula based on SCr of 0.84 mg/dL).   Medical History: Past Medical History:  Diagnosis Date  . Allergy   . Cataract   . GERD (gastroesophageal reflux disease)   . Herniated disc, cervical   . Hypercholesteremia   . Hypertension     Medications:  Scheduled:  . aspirin  325 mg Oral Once  . heparin  4,000 Units Intravenous Once  . potassium chloride SA  40 mEq Oral Once    Assessment: Pharmacy is consulted to dose heparin in 61 yo female diagnosed with chest pain/ACS. No listed blood thinners on home med list. Pt troponin trending upwards.   Baseline labs on 10/1:   Hgb 13.2, Hct 38.5  plt 283  Scr 0.84, crcl 70 ml/min  PT 12.2, INR 0.91  Goal of Therapy:  Heparin level 0.3-0.7 units/ml Monitor platelets by anticoagulation protocol: Yes   Plan:   Give 4000 units bolus x 1  Start heparin infusion at 850 units/hr   Heparin level 6 hours after start of infusion  Daily CBC while on heparin   Adalberto Cole, PharmD, BCPS Pager [phone] 06/16/2017 3:37 PM

## 2017-06-17 ENCOUNTER — Encounter (HOSPITAL_COMMUNITY): Payer: Self-pay

## 2017-06-17 ENCOUNTER — Other Ambulatory Visit (HOSPITAL_COMMUNITY): Payer: Self-pay

## 2017-06-17 ENCOUNTER — Encounter (HOSPITAL_COMMUNITY)
Admission: EM | Disposition: A | Discharge status: Home / Self Care | Payer: Self-pay | Source: Home / Self Care | Attending: Cardiovascular Disease

## 2017-06-17 DIAGNOSIS — I200 Unstable angina: Secondary | ICD-10-CM

## 2017-06-17 HISTORY — PX: LEFT HEART CATH AND CORONARY ANGIOGRAPHY: CATH118249

## 2017-06-17 HISTORY — PX: CARDIAC CATHETERIZATION: SHX172

## 2017-06-17 LAB — BASIC METABOLIC PANEL
Anion gap: 8 (ref 5–15)
BUN: 17 mg/dL (ref 6–20)
CALCIUM: 9.1 mg/dL (ref 8.9–10.3)
CHLORIDE: 105 mmol/L (ref 101–111)
CO2: 27 mmol/L (ref 22–32)
CREATININE: 0.82 mg/dL (ref 0.44–1.00)
GFR calc non Af Amer: >60 mL/min (ref >60)
Glucose, Bld: 101 mg/dL — ABNORMAL HIGH (ref 65–99)
Potassium: 3.5 mmol/L (ref 3.5–5.1)
SODIUM: 140 mmol/L (ref 135–145)

## 2017-06-17 LAB — CBC
HCT: 34.9 % — ABNORMAL LOW (ref 36.0–46.0)
HEMOGLOBIN: 12 g/dL (ref 12.0–15.0)
MCH: 28.6 pg (ref 26.0–34.0)
MCHC: 34.4 g/dL (ref 30.0–36.0)
MCV: 83.3 fL (ref 78.0–100.0)
Platelets: 270 10*3/uL (ref 150–400)
RBC: 4.19 MIL/uL (ref 3.87–5.11)
RDW: 14.1 % (ref 11.5–15.5)
WBC: 6.3 10*3/uL (ref 4.0–10.5)

## 2017-06-17 LAB — TROPONIN I
TROPONIN I: 0.18 ng/mL — AB (ref <0.03)
Troponin I: 0.25 ng/mL (ref <0.03)

## 2017-06-17 LAB — HEMOGLOBIN A1C
HEMOGLOBIN A1C: 6.3 % — AB (ref 4.8–5.6)
Mean Plasma Glucose: 134.11 mg/dL

## 2017-06-17 LAB — HIV ANTIBODY (ROUTINE TESTING W REFLEX): HIV Screen 4th Generation wRfx: NONREACTIVE

## 2017-06-17 LAB — PROTIME-INR
INR: 0.98
PROTHROMBIN TIME: 12.9 s (ref 11.4–15.2)

## 2017-06-17 LAB — MAGNESIUM: Magnesium: 1.8 mg/dL (ref 1.7–2.4)

## 2017-06-17 LAB — HEPARIN LEVEL (UNFRACTIONATED)
HEPARIN UNFRACTIONATED: 0.27 [IU]/mL — AB (ref 0.30–0.70)
Heparin Unfractionated: 0.3 IU/mL (ref 0.30–0.70)

## 2017-06-17 SURGERY — LEFT HEART CATH AND CORONARY ANGIOGRAPHY
Anesthesia: LOCAL

## 2017-06-17 MED ORDER — IOPAMIDOL (ISOVUE-370) INJECTION 76%
INTRAVENOUS | Status: AC
  Filled 2017-06-17: qty 100

## 2017-06-17 MED ORDER — HEPARIN SODIUM (PORCINE) 1000 UNIT/ML IJ SOLN
INTRAMUSCULAR | Status: DC | PRN
  Administered 2017-06-17: 4500 [IU] via INTRAVENOUS

## 2017-06-17 MED ORDER — SODIUM CHLORIDE 0.9% FLUSH
3.0000 mL | Freq: Two times a day (BID) | INTRAVENOUS | Status: DC
  Administered 2017-06-17: 3 mL via INTRAVENOUS

## 2017-06-17 MED ORDER — MIDAZOLAM HCL 2 MG/2ML IJ SOLN
INTRAMUSCULAR | Status: AC
  Filled 2017-06-17: qty 2

## 2017-06-17 MED ORDER — HEPARIN SODIUM (PORCINE) 1000 UNIT/ML IJ SOLN
INTRAMUSCULAR | Status: AC
  Filled 2017-06-17: qty 1

## 2017-06-17 MED ORDER — HEPARIN (PORCINE) IN NACL 100-0.45 UNIT/ML-% IJ SOLN
1000.0000 [IU]/h | INTRAMUSCULAR | Status: DC
  Administered 2017-06-17: 1000 [IU]/h via INTRAVENOUS
  Filled 2017-06-17: qty 250

## 2017-06-17 MED ORDER — FENTANYL CITRATE (PF) 100 MCG/2ML IJ SOLN
INTRAMUSCULAR | Status: DC | PRN
Start: 2017-06-17 — End: 2017-06-17
  Administered 2017-06-17: 25 ug via INTRAVENOUS

## 2017-06-17 MED ORDER — CLOPIDOGREL BISULFATE 75 MG PO TABS: 75.0000 mg | ORAL_TABLET | Freq: Every day | ORAL | 11 refills | Status: DC

## 2017-06-17 MED ORDER — SODIUM CHLORIDE 0.9 % IV SOLN: INTRAVENOUS | Status: AC

## 2017-06-17 MED ORDER — VERAPAMIL HCL 2.5 MG/ML IV SOLN
INTRAVENOUS | Status: AC
  Filled 2017-06-17: qty 2

## 2017-06-17 MED ORDER — ATORVASTATIN CALCIUM 40 MG PO TABS: 40.0000 mg | ORAL_TABLET | Freq: Every day | ORAL | 11 refills | Status: DC

## 2017-06-17 MED ORDER — HEPARIN (PORCINE) IN NACL 2-0.9 UNIT/ML-% IJ SOLN
INTRAMUSCULAR | Status: AC
  Filled 2017-06-17: qty 1000

## 2017-06-17 MED ORDER — LOSARTAN POTASSIUM 50 MG PO TABS: 50.0000 mg | ORAL_TABLET | Freq: Every day | ORAL | 11 refills | Status: DC

## 2017-06-17 MED ORDER — SODIUM CHLORIDE 0.9 % IV SOLN: 250.0000 mL | INTRAVENOUS | Status: DC | PRN

## 2017-06-17 MED ORDER — IOPAMIDOL (ISOVUE-370) INJECTION 76%
INTRAVENOUS | Status: DC | PRN
  Administered 2017-06-17: 80 mL via INTRA_ARTERIAL

## 2017-06-17 MED ORDER — HEPARIN (PORCINE) IN NACL 2-0.9 UNIT/ML-% IJ SOLN
INTRAMUSCULAR | Status: AC | PRN
  Administered 2017-06-17: 1000 mL

## 2017-06-17 MED ORDER — HYDROCHLOROTHIAZIDE 25 MG PO TABS: 12.5000 mg | ORAL_TABLET | Freq: Every day | ORAL | 3 refills | Status: DC

## 2017-06-17 MED ORDER — FENTANYL CITRATE (PF) 100 MCG/2ML IJ SOLN
INTRAMUSCULAR | Status: AC
  Filled 2017-06-17: qty 2

## 2017-06-17 MED ORDER — ASPIRIN 81 MG PO TBEC: 81.0000 mg | DELAYED_RELEASE_TABLET | Freq: Every day | ORAL | Status: AC

## 2017-06-17 MED ORDER — MIDAZOLAM HCL 2 MG/2ML IJ SOLN
INTRAMUSCULAR | Status: DC | PRN
  Administered 2017-06-17: 1 mg via INTRAVENOUS

## 2017-06-17 MED ORDER — LIDOCAINE HCL 2 % IJ SOLN
INTRAMUSCULAR | Status: AC
  Filled 2017-06-17: qty 10

## 2017-06-17 MED ORDER — SODIUM CHLORIDE 0.9% FLUSH: 3.0000 mL | INTRAVENOUS | Status: DC | PRN

## 2017-06-17 MED ORDER — VERAPAMIL HCL 2.5 MG/ML IV SOLN
INTRAVENOUS | Status: DC | PRN
  Administered 2017-06-17: 10 mL via INTRA_ARTERIAL

## 2017-06-17 SURGICAL SUPPLY — 13 items
CATH INFINITI 5FR ANG PIGTAIL (CATHETERS) ×2 IMPLANT
CATH OPTITORQUE TIG 4.0 5F (CATHETERS) ×2 IMPLANT
COVER PRB 48X5XTLSCP FOLD TPE (BAG) ×1 IMPLANT
COVER PROBE 5X48 (BAG) ×1
DEVICE RAD COMP TR BAND LRG (VASCULAR PRODUCTS) ×2 IMPLANT
GLIDESHEATH SLEND A-KIT 6F 22G (SHEATH) ×2 IMPLANT
GUIDEWIRE INQWIRE 1.5J.035X260 (WIRE) ×1 IMPLANT
INQWIRE 1.5J .035X260CM (WIRE) ×2
KIT HEART LEFT (KITS) ×2 IMPLANT
PACK CARDIAC CATHETERIZATION (CUSTOM PROCEDURE TRAY) ×2 IMPLANT
SYR MEDRAD MARK V 150ML (SYRINGE) ×2 IMPLANT
TRANSDUCER W/STOPCOCK (MISCELLANEOUS) ×2 IMPLANT
TUBING CIL FLEX 10 FLL-RA (TUBING) ×2 IMPLANT

## 2017-06-17 NOTE — Discharge Summary (Signed)
Discharge Summary    Patient ID: Caprisha Bridgett,  MRN: [id number], DOB/AGE: 1956/07/07 61 y.o.  Admit date: 06/16/2017 Discharge date: 06/17/2017  Primary Care Provider: Lenell Antu Primary Cardiologist: Dr. Royann Shivers  Discharge Diagnoses    Principal Problem:   NSTEMI (non-ST elevated myocardial infarction) [facility]) Active Problems:   HTN (hypertension)   Hyperlipidemia   Crescendo angina (HCC)  History of Present Illness     Tanaka Gillen is a 61 y.o. female with past medical history of HTN, HLD, and GERD who presented to [facility] ED on 06/16/2017 for evaluation of chest pain.   She reported that for the past 1-2 weeks she had developed intermitent exertional dyspnea and substernal chest pain, walking up inclines, which is new for her. Symptoms would improve with rest. The morning of presentation, while getting ready for work, she developed symptoms at rest with substernal chest pressure/ burning radiating up into her throat. She notes she has a h/o acid reflux, however this was much different than her usual reflux.   POC troponin was abnormal at 0.12 andEKG showed SR with borderline inferior TW abnormalities. With her concerning symptoms and elevated troponin, she was started on IV Heparin and admitted for further observation with plans for a cardiac catheterization the next day.    Hospital Course     Consultants: None   The following morning, she denied any repeat episodes of chest discomfort. Cyclic troponin values peaked at 0.25. K+ had been replaced and was stable at 3.5. She went for a cardiac catheterization later that day which showed only a 55% Ost Ramus lesion which was a small calliber vessel and not a PCI target. Thought that this was possibly the culprit lesion and stabilized with ASA and Heparin. LV function was normal with an EF of 55-65%. She was placed on ASA and Plavix per guidelines for an NSTEMI and will remain on DAPT for 1 year.   She was examined following her  procedure and her radial cath site was stable. She will continue on ASA, statin, and Plavix. Her HCTZ dosing was reduced to 12.5mg  daily and she was started on Losartan  daily due to her presenting hypokalemia. She will need a repeat BMET at the time of follow-up. 2-week Cardiology follow-up has been arranged.   _____________  Discharge Vitals Blood pressure 120/78, pulse 74, temperature 98 F (36.7 C), temperature source Oral, resp. rate (!) 21, height  (1.575 m), weight 182 lb 15.7 oz (83 kg), SpO2 98 %.  Filed Weights   06/16/17 0839 06/16/17 2140 06/17/17 0634  Weight: 183 lb (83 kg) 182 lb 15.7 oz (83 kg) 182 lb 15.7 oz (83 kg)   General: Pleasant African American female appearing in NAD Psych: Normal affect. Neuro: Alert and oriented X 3. Moves all extremities spontaneously. HEENT: Normal  Neck: Supple without bruits or JVD. Lungs:  Resp regular and unlabored, CTA without wheezing or rales. Heart: RRR no s3, s4, or murmurs. Abdomen: Soft, non-tender, non-distended, BS + x 4.  Extremities: No clubbing, cyanosis or edema. DP/PT/Radials 2+ and equal bilaterally. Right radial cath site stable without evidence of a hematoma.    Labs & Radiologic Studies     CBC  Recent Labs  06/16/17 0850 06/17/17 0416  WBC 6.3 6.3  HGB 13.2 12.0  HCT 38.5 34.9*  MCV 83.3 83.3  PLT 283 270   Basic Metabolic Panel  Recent Labs  06/16/17 0850 06/17/17 0049 06/17/17 0416  NA 141  --  140  K 2.9*  --  3.5  CL 103  --  105  CO2 29  --  27  GLUCOSE 121*  --  101*  BUN 17  --  17  CREATININE 0.84  --  0.82  CALCIUM 9.9  --  9.1  MG  --  1.8  --    Liver Function Tests  Recent Labs  06/16/17 1442  AST 33  ALT 35  ALKPHOS 53  BILITOT 0.3  PROT 8.4*  ALBUMIN 4.4    Recent Labs  06/16/17 1442  LIPASE 31   Cardiac Enzymes  Recent Labs  06/17/17 0049 06/17/17 0415  TROPONINI 0.25* 0.18*   BNP Invalid input(s): POCBNP D-Dimer No results for input(s):  DDIMER in the last 72 hours. Hemoglobin A1C  Recent Labs  06/17/17 0049  HGBA1C 6.3*   Fasting Lipid Panel No results for input(s): CHOL, HDL, LDLCALC, TRIG, CHOLHDL, LDLDIRECT in the last 72 hours. Thyroid Function Tests No results for input(s): TSH, T4TOTAL, T3FREE, THYROIDAB in the last 72 hours.  Invalid input(s): FREET3  Dg Chest 2 View  Result Date: 06/16/2017 CLINICAL DATA:  Chest pain. EXAM: CHEST  2 VIEW COMPARISON:  None. FINDINGS: The heart size and mediastinal contours are within normal limits. Both lungs are clear. Degenerative changes of the thoracolumbar spine. No acute osseous abnormality. IMPRESSION: No active cardiopulmonary disease. Electronically Signed   By: Obie Dredge M.D.   On: 06/16/2017 09:06     Diagnostic Studies/Procedures    Cardiac Catheterization: 06/17/2017  Ost Ramus lesion, 55 %stenosed.  The left ventricular systolic function is normal. The left ventricular ejection fraction is 55-65% by visual estimate.  LV end diastolic pressure is moderately elevated.   Angiographically, the only potential culprit lesion is the ostial ramus lesion that is in a small caliber vessel and not PCI targets based on size and location of the lesion. Angiographically the lesion does not appear to be flow-limiting at present. This could have potentially been the culprit lesion and was stabilized with aspirin and heparin.  No PCI option. However, given presentation of ACS would consider treatment with aspirin and Plavix for one yearper guidelines.   Plan: Return to nursing unit for ongoing care & TR band removal. Defer further plan to primary service, however I have discussed results with Dr.Bostyn Kunkler  Disposition   Pt is being discharged home today in good condition.  Follow-up Plans & Appointments    Follow-up Information    Ellsworth Lennox, PA-C Follow up.   Specialties:  Physician Assistant, Cardiology Why:  Cardiology Follow-Up on 07/01/2017  at 10:00AM.  Contact information: [address] [place] [address] [phone]          Discharge Instructions    Diet - low sodium heart healthy    Complete by:  As directed    Discharge instructions    Complete by:  As directed    PLEASE REMEMBER TO BRING ALL OF YOUR MEDICATIONS TO EACH OF YOUR FOLLOW-UP OFFICE VISITS.  PLEASE ATTEND ALL SCHEDULED FOLLOW-UP APPOINTMENTS.   Activity: Increase activity slowly as tolerated. You may shower, but no soaking baths (or swimming) for 1 week. No driving for 24 hours. No lifting over 5 lbs for 1 week. No sexual activity for 1 week.   You May Return to Work: in 1 week (if applicable)  Wound Care: You may wash cath site gently with soap and water. Keep cath site clean and dry. If you notice pain, swelling,  bleeding or pus at your cath site, please call [phone].   Increase activity slowly    Complete by:  As directed       Discharge Medications     Medication List    STOP taking these medications   BC HEADACHE PO   FLAXSEED OIL PO   ibuprofen 600 MG tablet Commonly known as:  ADVIL,MOTRIN   pravastatin 80 MG tablet Commonly known as:  PRAVACHOL     TAKE these medications   aspirin 81 MG EC tablet Take 1 tablet (81 mg total) by mouth daily.   atorvastatin 40 MG tablet Commonly known as:  LIPITOR Take 1 tablet (40 mg total) by mouth daily at 6 PM.   clopidogrel 75 MG tablet Commonly known as:  PLAVIX Take 1 tablet (75 mg total) by mouth daily.   fluticasone 50 MCG/ACT nasal spray Commonly known as:  FLONASE Place 2 sprays into both nostrils daily. What changed:  when to take this  reasons to take this   hydrochlorothiazide 25 MG tablet Commonly known as:  HYDRODIURIL Take 0.5 tablets (12.5 mg total) by mouth daily. What changed:  how much to take   loratadine 10 MG tablet Commonly known as:  CLARITIN Take 1 tablet (10 mg total) by mouth daily. What changed:  when to take  this  reasons to take this   losartan 50 MG tablet Commonly known as:  COZAAR Take 1 tablet (50 mg total) by mouth daily.   meclizine 25 MG tablet Commonly known as:  ANTIVERT Take 1 tablet (25 mg total) by mouth once.   multivitamin with minerals Tabs tablet Take 1 tablet by mouth daily.   omeprazole 20 MG capsule Commonly known as:  PRILOSEC Take 1 capsule (20 mg total) by mouth daily.   vitamin C 500 MG tablet Commonly known as:  ASCORBIC ACID Take 500 mg by mouth daily.       Aspirin prescribed at discharge?  Yes High Intensity Statin Prescribed? (Lipitor 40-80mg  or Crestor 20-40mg ): Yes Beta Blocker Prescribed? No: Bradycardia For EF 40% or less, Was ACEI/ARB Prescribed? Yes ADP Receptor Inhibitor Prescribed? (i.e. Plavix etc.-Includes Medically Managed Patients): Yes For EF <40%, Aldosterone Inhibitor Prescribed? No: EF > 40% Was EF assessed during THIS hospitalization? Yes Was Cardiac Rehab II ordered? (Included Medically managed Patients): No   Allergies Allergies  Allergen Reactions  . Flagyl [Metronidazole] Rash     Outstanding Labs/Studies   BMET at follow-up appointment  Duration of Discharge Encounter   Greater than 30 minutes including physician time.  Signed, Ellsworth Lennox, PA-C 06/17/2017, 4:35 PM  I have seen and examined the patient along with Ellsworth Lennox, PA-C.  I have reviewed the chart, notes and new data.  I agree with PA/NP's note.  Key new complaints: completely asymptomatic Key examination changes: no problems at cath access site Key new findings / data: angio images reviewed w Dr. Herbie Baltimore  PLAN: Dual antiplatelet therapy for 12 months. Low suspicion for pulmonary embolism. Statin for target LDL<70. Echo as outpatient if not finished today.  Thurmon Fair, MD, [facility] [phone] 06/17/2017, 4:38 PM

## 2017-06-17 NOTE — Progress Notes (Signed)
TR BAND REMOVAL  LOCATION:    right radial  DEFLATED PER PROTOCOL:    Yes.    TIME BAND OFF / DRESSING APPLIED:    1250   SITE UPON ARRIVAL:    Level 0  SITE AFTER BAND REMOVAL:    Level 0  CIRCULATION SENSATION AND MOVEMENT:    Within Normal Limits   Yes.    COMMENTS:   Tolerated well. No bleeding noted. Post removal instructions provided. Teach back effective.

## 2017-06-17 NOTE — Progress Notes (Signed)
CRITICAL VALUE ALERT  Critical Value:  Troponin 0.25  Date & Time Notied:  06-17-17 147am  Provider Notified: Cardiology  Orders Received/Actions taken:

## 2017-06-17 NOTE — ED Notes (Signed)
Called report to Glenis Smoker, RN at [facility] at [place]. Left number in case she had additional questions.

## 2017-06-17 NOTE — ED Notes (Signed)
[facility] here to pick up patient. Called receiving RN to inform her. Gave report to [facility] & updated patient.

## 2017-06-17 NOTE — Interval H&P Note (Signed)
History and Physical Interval Note:  06/17/2017 8:40 AM  Casey Peterson  has presented today for surgery, with the diagnosis of crescendo angina with mild non-stemi.  The various methods of treatment have been discussed with the patient and family. After consideration of risks, benefits and other options for treatment, the patient has consented to  Procedure(s): LEFT HEART CATH AND CORONARY ANGIOGRAPHY (N/A) with possible PERCUTANEOUS CORONARY INTERVENTION as a surgical intervention .  The patient's history has been reviewed, patient examined, no change in status, stable for surgery.  I have reviewed the patient's chart and labs.  Questions were answered to the patient's satisfaction.    Cath Lab Visit (complete for each Cath Lab visit)  Clinical Evaluation Leading to the Procedure:   ACS: Yes.    Non-ACS:    Anginal Classification: CCS IV  Anti-ischemic medical therapy: Minimal Therapy (1 class of medications)  Non-Invasive Test Results: No non-invasive testing performed  Prior CABG: No previous CABG   Bryan Lemma

## 2017-06-17 NOTE — Care Management Note (Signed)
Case Management Note  Patient Details  Name: Joslynn Jamroz MRN: [id number] Date of Birth: 1955/10/22  Subjective/Objective:   From home with spouse, pta indep, s/p heart cath, No PCI option per cath report, will be on plavix and asa.  Patient goes to [facility] but states she would like to go to [facility].  NCM trying to schedule apt but they ar full and rep states patient will need to call on Monday to be put on walk in schedule. NCM will give patient a Match Letter to get medications at discharge and informed patient to make sure to follow up with scheduling a walk in visit on Monday.                 Action/Plan: NCM gave patient a Match letter for assist with medications and she is to call on Monday to schedule walk in visit at the [facility] clinic.  Expected Discharge Date:   (unknown)               Expected Discharge Plan:  Home/Self Care  In-House Referral:     Discharge planning Services  CM Consult, Medication Assistance, Indigent Health Clinic  Post Acute Care Choice:    Choice offered to:     DME Arranged:    DME Agency:     HH Arranged:    HH Agency:     Status of Service:  Completed, signed off  If discussed at [employer] of Stay Meetings, dates discussed:    Additional Comments:  Leone Haven, RN 06/17/2017, 11:19 AM

## 2017-06-17 NOTE — ED Notes (Signed)
Called [facility] to transport patient to [facility] inpatient.

## 2017-06-17 NOTE — Progress Notes (Signed)
ANTICOAGULATION CONSULT NOTE - f/u Consult  Pharmacy Consult for IV heparin Indication: chest pain/ACS  Allergies  Allergen Reactions  . Flagyl [Metronidazole] Rash    Patient Measurements: Height:  (157.5 cm) Weight: 183 lb (83 kg) IBW/kg (Calculated) : 50.1 Heparin Dosing Weight: 68.7 kg  Vital Signs: BP: 112/71 (10/01 2300) Pulse Rate: 67 (10/01 2300)  Labs:  Recent Labs  06/16/17 0850 06/17/17 0049  HGB 13.2  --   HCT 38.5  --   PLT 283  --   LABPROT 12.2  --   INR 0.91  --   HEPARINUNFRC  --  0.27*  CREATININE 0.84  --     Estimated Creatinine Clearance: 69.4 mL/min (by C-G formula based on SCr of 0.84 mg/dL).   Medical History: Past Medical History:  Diagnosis Date  . Allergy   . Cataract   . GERD (gastroesophageal reflux disease)   . Herniated disc, cervical   . Hypercholesteremia   . Hypertension     Medications:  Scheduled:  . aspirin  81 mg Oral Pre-Cath  . aspirin EC  81 mg Oral Daily  . atorvastatin  80 mg Oral q1800  . Influenza vac split quadrivalent PF  0.5 mL Intramuscular Once  . pantoprazole  40 mg Oral Daily  . sodium chloride flush  3 mL Intravenous Q12H    Assessment: Pharmacy is consulted to dose heparin in 61 yo female diagnosed with chest pain/ACS. No listed blood thinners on home med list. Pt troponin trending upwards.   Baseline labs on 10/1:   Hgb 13.2, Hct 38.5  plt 283  Scr 0.84, crcl 70 ml/min  PT 12.2, INR 0.91 Today, 10/2  0049 HL=0.27 below goal, no infusion or bleeding issues per RN  Goal of Therapy:  Heparin level 0.3-0.7 units/ml Monitor platelets by anticoagulation protocol: Yes   Plan:   Increase heparin drip to 1000 units/hr  Heparin level 6 hours after heparin increased  Daily CBC while on heparin    Lorenza Evangelist 06/17/2017 1:31 AM

## 2017-06-18 MED FILL — Lidocaine HCl Local Inj 2%: INTRAMUSCULAR | Qty: 10 | Status: AC

## 2017-06-23 ENCOUNTER — Encounter: Payer: Self-pay | Admitting: *Deleted

## 2017-06-30 NOTE — Progress Notes (Signed)
Cardiology Office Note    Date:  07/01/2017   ID:  Case[facility], DOB 03/11/1956, MRN [id number]  PCP:  Patient, No Pcp Per  Cardiologist: Dr. Royann [facility]  Chief Complaint  Patient presents with  . Hospitalization Follow-up    s/p NSTEMI    History of Present Illness:    Casey [facility] is a 61 y.o. female with past medical history of HTN, HLD, and GERD who presents to the office today for hospital follow-up.   She was recently admitted to [facility] from 10/1 - 06/17/2017 for evaluation of chest pain which occurred with activity and improved with rest. Initial EKG showed SR with borderline inferior TW abnormalities and cyclic troponin values peaked at 0.25. A cardiac catheterization was performed and showed only a 55% Ost Ramus lesion which was a small calliber vessel and not a PCI target. Thought that this was possibly the culprit lesion and stabilized with ASA and Heparin. LV function was normal with an EF of 55-65%. She was placed on ASA and Plavix per guidelines for an NSTEMI and will remain on DAPT for 1 year. Was also started on Atorvastatin  daily and Losartan  daily.   In talking with the patient today, she reports doing well since her recent hospitalization. She denies any repeat episodes of chest pain, dyspnea on exertion, or palpitations. No orthopnea, PND, lower extremity edema, or syncope.  She has significantly changed her diet as she was previously consuming [employer] biscuits every morning but is now consuming fresh fruit for breakfast and is focusing on more fruits and vegetables as compared to pastas and bread. She has also joined [facility] and is exercising multiple days per week without anginal symptoms.   She reports good compliance with her medication regimen and denies missing any doses of Aspirin or Plavix. No evidence of active bleeding but does notice she bruises more easily. She has returned to work in [employer] at [facility] and is able to perform  her job duties without any symptoms.    Past Medical History:  Diagnosis Date  . Allergy   . CAD (coronary artery disease)    a. 06/2017: NSTEMI with cath showing 55% Ost Ramus stenosis (too small for PCI). LVEF normal.   . Cataract   . GERD (gastroesophageal reflux disease)   . Headache   . Herniated disc, cervical   . Hypercholesteremia   . Hypertension     Past Surgical History:  Procedure Laterality Date  . CARDIAC CATHETERIZATION  06/17/2017  . LEFT HEART CATH AND CORONARY ANGIOGRAPHY N/A 06/17/2017   Procedure: LEFT HEART CATH AND CORONARY ANGIOGRAPHY;  Surgeon: Casey Lex, MD;  Location: [facility];  Service: Cardiovascular;  Laterality: N/A;    Current Medications: Outpatient Medications Prior to Visit  Medication Sig Dispense Refill  . aspirin EC 81 MG EC tablet Take 1 tablet (81 mg total) by mouth daily.    Marland Kitchen atorvastatin (LIPITOR) 40 MG tablet Take 1 tablet (40 mg total) by mouth daily at 6 PM. 30 tablet 11  . clopidogrel (PLAVIX) 75 MG tablet Take 1 tablet (75 mg total) by mouth daily. 30 tablet 11  . fluticasone (FLONASE) 50 MCG/ACT nasal spray Place 2 sprays into both nostrils daily. (Patient taking differently: Place 2 sprays into both nostrils daily as needed for allergies. ) 16 g 9  . hydrochlorothiazide (HYDRODIURIL) 25 MG tablet Take 0.5 tablets (12.5 mg total) by mouth daily. 90 tablet 3  . loratadine (CLARITIN)  10 MG tablet Take 1 tablet (10 mg total) by mouth daily. (Patient taking differently: Take 10 mg by mouth daily as needed for allergies. ) 90 tablet 3  . losartan (COZAAR) 50 MG tablet Take 1 tablet (50 mg total) by mouth daily. 30 tablet 11  . Multiple Vitamin (MULTIVITAMIN WITH MINERALS) TABS tablet Take 1 tablet by mouth daily.    . vitamin C (ASCORBIC ACID) 500 MG tablet Take 500 mg by mouth daily.    Marland Kitchen omeprazole (PRILOSEC) 20 MG capsule Take 1 capsule (20 mg total) by mouth daily. 90 capsule 3  . meclizine (ANTIVERT) 25 MG tablet  Take 1 tablet (25 mg total) by mouth once. (Patient not taking: Reported on 06/16/2017) 30 tablet 0   No facility-administered medications prior to visit.      Allergies:   Flagyl [metronidazole]   Social History   Social History  . Marital status: Married    Spouse name: [facility]  . Number of children: 2  . 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 Smoking status: Never Smoker  . Smokeless tobacco: Former Neurosurgeon    Types: Snuff    Quit date: 01/15/2011  . Alcohol use No  . Drug use: Yes    Types: "Crack" cocaine     Comment: "in the 80s"  . Sexual activity: Yes    Partners: Male   Other Topics Concern  . None   Social History Narrative  . None     Family History:  The patient's family history includes Cancer in her mother; Diabetes in her mother, sister, and sister; Heart disease in her mother; Hyperlipidemia in her mother and sister; Hypertension in her father and mother; Stroke in her father, mother, and sister.   Review of Systems:   Please see the history of present illness.     General:  No chills, fever, night sweats or weight changes.  Cardiovascular:  No chest pain, dyspnea on exertion, edema, orthopnea, palpitations, paroxysmal nocturnal dyspnea. Dermatological: No rash, lesions/masses Respiratory: No cough, dyspnea Urologic: No hematuria, dysuria Abdominal:   No nausea, vomiting, diarrhea, bright red blood per rectum, melena, or hematemesis Neurologic:  No visual changes, wkns, changes in mental status.  She denies any of the above symptoms.   All other systems reviewed and are otherwise negative except as noted above.   Physical Exam:    VS:  BP 112/80   Pulse 70   Ht  (1.575 m)   Wt 178 lb 12.8 oz (81.1 kg)   SpO2 97%   BMI 32.70 kg/m    General: Well developed, well nourished [facility] female appearing in no acute distress. Head: Normocephalic, atraumatic,  sclera non-icteric, no xanthomas, nares are without discharge.  Neck: No carotid bruits. JVD not elevated.  Lungs: Respirations regular and unlabored, without wheezes or rales.  Heart: Regular rate and rhythm. No S3 or S4.  No murmur, no rubs, or gallops appreciated. Abdomen: Soft, non-tender, non-distended with normoactive bowel sounds. No hepatomegaly. No rebound/guarding. No obvious abdominal masses. Msk:  Strength and tone appear normal for age. No joint deformities or effusions. Extremities: No clubbing or cyanosis. No edema.  Distal pedal pulses are 2+ bilaterally. Right radial cath site without ecchymosis or evidence of a hematoma. Small, firm hematoma along right calf.  Neuro: Alert and oriented X 3. Moves all extremities spontaneously. No focal deficits noted. Psych:  Responds to questions  appropriately with a normal affect. Skin: No rashes or lesions noted  Wt Readings from Last 3 Encounters:  07/01/17 178 lb 12.8 oz (81.1 kg)  06/17/17 182 lb 15.7 oz (83 kg)  01/14/17 177 lb (80.3 kg)     Studies/Labs Reviewed:   EKG:  EKG is not ordered today.   Recent Labs: 06/16/2017: ALT 35 06/17/2017: BUN 17; Creatinine, Ser 0.82; Hemoglobin 12.0; Magnesium 1.8; Platelets 270; Potassium 3.5; Sodium 140   Lipid Panel    Component Value Date/Time   CHOL 210 (H) 01/14/2017 1555   TRIG 64 01/14/2017 1555   HDL 112 01/14/2017 1555   CHOLHDL 1.9 01/14/2017 1555   CHOLHDL 1.8 12/14/2014 0939   VLDL 12 12/14/2014 0939   LDLCALC 85 01/14/2017 1555    Additional studies/ records that were reviewed today include:   Cardiac Catheterization: 06/17/2017  Ost Ramus lesion, 55 %stenosed.  The left ventricular systolic function is normal. The left ventricular ejection fraction is 55-65% by visual estimate.  LV end diastolic pressure is moderately elevated.   Angiographically, the only potential culprit lesion is the ostial ramus lesion that is in a small caliber vessel and not PCI targets  based on size and location of the lesion. Angiographically the lesion does not appear to be flow-limiting at present. This could have potentially been the culprit lesion and was stabilized with aspirin and heparin.  No PCI option. However, given presentation of ACS would consider treatment with aspirin and Plavix for one yearper guidelines.   Plan: Return to nursing unit for ongoing care & TR band removal. Defer further plan to primary service, however I have discussed results with Dr.Croitoru  Assessment:    1. Non-ST elevation myocardial infarction (NSTEMI), subsequent episode of care (HCC)   2. Coronary artery disease involving native coronary artery of native heart without angina pectoris   3. Medication management   4. Essential hypertension   5. Hyperlipidemia LDL goal <70   6. Gastroesophageal reflux disease without esophagitis   7. Hypokalemia      Plan:   In order of problems listed above:  1. Subsequent Episode of Care for NSTEMI/ CAD - the patient was recently admitted for evaluation of chest pain with cyclic troponin values peaking at 0.25. Catheterization was performed and showed only a 55% Ost Ramus lesion which was a small calliber vessel and not a PCI target. Thought that this was possibly the culprit lesion and stabilized with ASA and Heparin.  - She denies any repeat episodes of chest pain or dyspnea on exertion since hospital discharge. She has started exercising at a local gym and denies any anginal symptoms with this. Has made dietary changes and is limiting intake of breads and pastas, having lost over 3 lbs since hospital discharge. - will continue on DAPT with ASA and Plavix for 1 year with her recent NSTEMI. Continue statin therapy. Not on a BB secondary to noted bradycardia during her recent admission.   2. HTN - BP is well-controlled at 112/80 during today's visit. - continue HCTZ 12.5mg  daily and Losartan  daily. Will recheck BMET today.   3.  HLD - Lipid Panel in 01/2017 showed total cholesterol of 210, HDL 112, and LDL 85. - recently switched to Atorvastatin  daily and is tolerating this well. Continue statin therapy with goal LDL < 70. Will need repeat FLP/LFT's in 6-8 weeks.   4. GERD - currently on Omeprazole. Will switch to Protonix due to the drug interaction with Plavix.  5. Hypokalemia - K+ was at 2.9 at the time of recent hospital admission. HCTZ dosing was decreased and she was started on Losartan.  - will recheck BMET today.    Medication Adjustments/Labs and Tests Ordered: Current medicines are reviewed at length with the patient today.  Concerns regarding medicines are outlined above.  Medication changes, Labs and Tests ordered today are listed in the Patient Instructions below. Patient Instructions  Randall An, [place] has recommended making the following medication changes: 1. STOP Omeprazole 2. START Pantoprazole 20 mg - take 1 tablet by mouth daily  Your physician recommends that you return for [facility] work TODAY.  [place] recommends that you schedule a follow-up appointment in 3 months with Dr [facility].  If you need a refill on your cardiac medications before your next appointment, please call your pharmacy.    Signed, Ellsworth Lennox, PA-C  07/01/2017 1:39 PM    [facility] [address] [place], [address] [place], [place]  [address] Phone: [phone]; Fax: [phone]  [address] [place], [place] [address] Phone: [phone]

## 2017-07-01 ENCOUNTER — Encounter: Payer: Self-pay | Admitting: Student

## 2017-07-01 ENCOUNTER — Ambulatory Visit (INDEPENDENT_AMBULATORY_CARE_PROVIDER_SITE_OTHER): Payer: Self-pay | Admitting: Student

## 2017-07-01 VITALS — BP 112/80 | HR 70 | Ht 62.0 in | Wt 178.8 lb

## 2017-07-01 DIAGNOSIS — Z79899 Other long term (current) drug therapy: Secondary | ICD-10-CM

## 2017-07-01 DIAGNOSIS — K219 Gastro-esophageal reflux disease without esophagitis: Secondary | ICD-10-CM

## 2017-07-01 DIAGNOSIS — I214 Non-ST elevation (NSTEMI) myocardial infarction: Secondary | ICD-10-CM

## 2017-07-01 DIAGNOSIS — I2510 Atherosclerotic heart disease of native coronary artery without angina pectoris: Secondary | ICD-10-CM | POA: Insufficient documentation

## 2017-07-01 DIAGNOSIS — E876 Hypokalemia: Secondary | ICD-10-CM

## 2017-07-01 DIAGNOSIS — E785 Hyperlipidemia, unspecified: Secondary | ICD-10-CM

## 2017-07-01 DIAGNOSIS — I10 Essential (primary) hypertension: Secondary | ICD-10-CM

## 2017-07-01 LAB — BASIC METABOLIC PANEL
BUN/Creatinine Ratio: 16 (ref 12–28)
BUN: 12 mg/dL (ref 8–27)
CHLORIDE: 101 mmol/L (ref 96–106)
CO2: 25 mmol/L (ref 20–29)
CREATININE: 0.73 mg/dL (ref 0.57–1.00)
Calcium: 10.3 mg/dL (ref 8.7–10.3)
GFR calc Af Amer: 102 mL/min/{1.73_m2} (ref >59)
GFR calc non Af Amer: 89 mL/min/{1.73_m2} (ref >59)
GLUCOSE: 88 mg/dL (ref 65–99)
Potassium: 3.4 mmol/L — ABNORMAL LOW (ref 3.5–5.2)
Sodium: 143 mmol/L (ref 134–144)

## 2017-07-01 MED ORDER — PANTOPRAZOLE SODIUM 20 MG PO TBEC: 20.0000 mg | DELAYED_RELEASE_TABLET | Freq: Every day | ORAL | 11 refills | Status: DC

## 2017-07-01 NOTE — Progress Notes (Signed)
Did we miss that omeprazole-plavix problem at DC? Thanks for addressing it.  MCr

## 2017-07-01 NOTE — Patient Instructions (Addendum)
[facility] has recommended making the following medication changes: 1. STOP Omeprazole 2. START Pantoprazole 20 mg - take 1 tablet by mouth daily  Your physician recommends that you return for lab work TODAY.  [place] recommends that you schedule a follow-up appointment in 3 months with Dr Royann Shivers.  If you need a refill on your cardiac medications before your next appointment, please call your pharmacy.

## 2017-07-02 ENCOUNTER — Telehealth: Payer: Self-pay | Admitting: Student

## 2017-07-02 MED ORDER — PANTOPRAZOLE SODIUM 20 MG PO TBEC: 20.0000 mg | DELAYED_RELEASE_TABLET | Freq: Every day | ORAL | 11 refills | Status: DC

## 2017-07-02 NOTE — Telephone Encounter (Signed)
Refill submitted to patient's preferred pharmacy. Informed patient. Pt voiced understanding, no other stated concerns at this time.  

## 2017-07-02 NOTE — Telephone Encounter (Signed)
New message   Pt wants to know if her new medication can be called [facility] instead of [facility]. [facility]  *STAT* If patient is at the pharmacy, call can be transferred to refill team.   1. Which medications need to be refilled? (please list name of each medication and dose if known) pantoprazole 20 mg  2. Which pharmacy/location (including street and city if local pharmacy) is medication to be sent to? [facility] on [address]  3. Do they need a 30 day or 90 day supply? 30 day

## 2017-07-08 ENCOUNTER — Telehealth: Payer: Self-pay | Admitting: Cardiovascular Disease

## 2017-07-08 DIAGNOSIS — Z79899 Other long term (current) drug therapy: Secondary | ICD-10-CM

## 2017-07-08 DIAGNOSIS — E785 Hyperlipidemia, unspecified: Secondary | ICD-10-CM

## 2017-07-08 NOTE — Telephone Encounter (Signed)
Patient called w/results She prefers to increase K+ rich foods in diet Repeat BMET ordered - lab slip mailed  Patient inquired about cholesterol labs. Per last note, to have lipid/liver in 6-8 weeks (not ordered). Labs ordered + lab slips mailed

## 2017-07-08 NOTE — Telephone Encounter (Signed)
Mrs. [facility] is returning a call about her lab work . States she is at work and to please call back after 3pm . Thanks

## 2017-07-08 NOTE — Telephone Encounter (Signed)
Notes recorded by Ellsworth LennoxStrader, Brittany M, PA-C on 07/07/2017 at 7:34 AM EDT Please inform the patient her kidney function remains stable. K+ slightly low at 3.4. Please encourage her to increase intake of high-potassium foods (bananas, greens, etc.). If already consuming a high-potassium diet, would prescribe K-dur 10 mEq daily. Will need repeat BMET in 2 weeks with either option. Thank you

## 2017-07-10 ENCOUNTER — Telehealth: Payer: Self-pay | Admitting: *Deleted

## 2017-07-10 NOTE — Telephone Encounter (Signed)
Returned the call to the patient to discuss her lab results. She verbalized her understanding.   She stated that she cannot afford the pantoprazole which was prescribed. She would like to know if there is something cheaper she may take to replace the pantoprazole. Will route to the provider.

## 2017-07-11 ENCOUNTER — Other Ambulatory Visit: Payer: Self-pay | Admitting: Student

## 2017-07-11 ENCOUNTER — Other Ambulatory Visit: Payer: Self-pay

## 2017-07-11 MED ORDER — RANITIDINE HCL 150 MG PO TABS: 75.0000 mg | ORAL_TABLET | Freq: Two times a day (BID) | ORAL | 3 refills | Status: DC

## 2017-07-11 NOTE — Telephone Encounter (Signed)
Returned call to pt she states that she will try the Zantac BID and let us know if this is on the $4 list and how it works for her.

## 2017-07-11 NOTE — Telephone Encounter (Signed)
She could try Zantac as it is on the $4 list at most major retail pharmacies. Rx would be for 150mg  BID. If she wishes to stick to a once daily Rx, could try [facility] as I believe they are significantly more reasonable for PPI prescriptions. Thank you.

## 2017-07-23 LAB — HEPATIC FUNCTION PANEL
ALK PHOS: 60 IU/L (ref 39–117)
ALT: 27 IU/L (ref 0–32)
AST: 30 IU/L (ref 0–40)
Albumin: 4.5 g/dL (ref 3.6–4.8)
BILIRUBIN TOTAL: 0.5 mg/dL (ref 0.0–1.2)
BILIRUBIN, DIRECT: 0.1 mg/dL (ref 0.00–0.40)
Total Protein: 7.5 g/dL (ref 6.0–8.5)

## 2017-07-23 LAB — BASIC METABOLIC PANEL
BUN / CREAT RATIO: 17 (ref 12–28)
BUN: 12 mg/dL (ref 8–27)
CHLORIDE: 102 mmol/L (ref 96–106)
CO2: 26 mmol/L (ref 20–29)
CREATININE: 0.71 mg/dL (ref 0.57–1.00)
Calcium: 9.9 mg/dL (ref 8.7–10.3)
GFR calc Af Amer: 106 mL/min/{1.73_m2} (ref >59)
GFR calc non Af Amer: 92 mL/min/{1.73_m2} (ref >59)
GLUCOSE: 94 mg/dL (ref 65–99)
Potassium: 4.3 mmol/L (ref 3.5–5.2)
SODIUM: 143 mmol/L (ref 134–144)

## 2017-07-23 LAB — LIPID PANEL
CHOLESTEROL TOTAL: 179 mg/dL (ref 100–199)
Chol/HDL Ratio: 1.8 ratio (ref 0.0–4.4)
HDL: 101 mg/dL (ref >39)
LDL Calculated: 70 mg/dL (ref 0–99)
TRIGLYCERIDES: 41 mg/dL (ref 0–149)
VLDL Cholesterol Cal: 8 mg/dL (ref 5–40)

## 2017-09-15 DIAGNOSIS — H269 Unspecified cataract: Secondary | ICD-10-CM | POA: Insufficient documentation

## 2017-09-15 DIAGNOSIS — K219 Gastro-esophageal reflux disease without esophagitis: Secondary | ICD-10-CM | POA: Insufficient documentation

## 2017-09-15 DIAGNOSIS — M5020 Other cervical disc displacement, unspecified cervical region: Secondary | ICD-10-CM | POA: Insufficient documentation

## 2017-09-15 DIAGNOSIS — T7840XA Allergy, unspecified, initial encounter: Secondary | ICD-10-CM | POA: Insufficient documentation

## 2017-09-15 DIAGNOSIS — R51 Headache: Secondary | ICD-10-CM

## 2017-09-15 DIAGNOSIS — R519 Headache, unspecified: Secondary | ICD-10-CM | POA: Insufficient documentation

## 2017-09-25 ENCOUNTER — Other Ambulatory Visit: Payer: Self-pay

## 2017-09-25 ENCOUNTER — Encounter: Payer: Self-pay | Admitting: Family Medicine

## 2017-09-25 ENCOUNTER — Ambulatory Visit: Payer: BLUE CROSS/BLUE SHIELD | Admitting: Family Medicine

## 2017-09-25 VITALS — BP 120/80 | HR 78 | Temp 98.6°F | Resp 17 | Ht 62.0 in | Wt 173.4 lb

## 2017-09-25 DIAGNOSIS — M5431 Sciatica, right side: Secondary | ICD-10-CM | POA: Diagnosis not present

## 2017-09-25 DIAGNOSIS — R252 Cramp and spasm: Secondary | ICD-10-CM | POA: Diagnosis not present

## 2017-09-25 DIAGNOSIS — I10 Essential (primary) hypertension: Secondary | ICD-10-CM | POA: Diagnosis not present

## 2017-09-25 LAB — POCT URINALYSIS DIP (MANUAL ENTRY)
BILIRUBIN UA: NEGATIVE mg/dL
Bilirubin, UA: NEGATIVE
Blood, UA: NEGATIVE
Glucose, UA: NEGATIVE mg/dL
Nitrite, UA: NEGATIVE
PH UA: 5 (ref 5.0–8.0)
PROTEIN UA: NEGATIVE mg/dL
SPEC GRAV UA: 1.015 (ref 1.010–1.025)
Urobilinogen, UA: 0.2 E.U./dL

## 2017-09-25 MED ORDER — DICLOFENAC SODIUM 1 % TD GEL: 2.0000 g | Freq: Four times a day (QID) | TRANSDERMAL | 1 refills | Status: DC

## 2017-09-25 NOTE — Progress Notes (Signed)
Chief Complaint  Patient presents with  . leg cramps    HPI  Hypertension: Patient here for follow-up of elevated blood pressure. She is exercising and is adherent to low salt diet.  Blood pressure is well controlled at home. Cardiac symptoms none. Patient denies chest pain, chest pressure/discomfort, claudication, dyspnea, fatigue, lower extremity edema and orthopnea.  Cardiovascular risk factors: hypertension and obesity (BMI >= 30 kg/m2). Use of agents associated with hypertension: none. History of target organ damage: angina/ prior myocardial infarction.  She had a heart attack in June 16, 2017.  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 Allergy   . CAD (coronary artery disease)    a. 06/2017: NSTEMI with cath showing 55% Ost Ramus stenosis (too small for PCI). LVEF normal.   . Cataract   . GERD (gastroesophageal reflux disease)   . Headache   . Herniated disc, cervical   . Hypercholesteremia   . Hypertension     Current Outpatient  Medications  Medication Sig Dispense Refill  . aspirin EC 81 MG EC tablet Take 1 tablet (81 mg total) by mouth daily.    Marland Kitchen atorvastatin (LIPITOR) 40 MG tablet Take 1 tablet (40 mg total) by mouth daily at 6 PM. 30 tablet 11  . clopidogrel (PLAVIX) 75 MG tablet Take 1 tablet (75 mg total) by mouth daily. 30 tablet 11  . fluticasone (FLONASE) 50 MCG/ACT nasal spray Place 2 sprays into both nostrils daily. (Patient taking differently: Place 2 sprays into both nostrils daily as needed for allergies. ) 16 g 9  . hydrochlorothiazide (HYDRODIURIL) 25 MG tablet Take 0.5 tablets (12.5 mg total) by mouth daily. 90 tablet 3  . losartan (COZAAR) 50 MG tablet Take 1 tablet (50 mg total) by mouth daily. 30 tablet 11  . meclizine (ANTIVERT) 25 MG tablet Take 25 mg by mouth 3 (three) times daily as needed for dizziness.    . Multiple Vitamin (MULTIVITAMIN WITH MINERALS) TABS tablet Take 1 tablet by mouth daily.    . ranitidine (ZANTAC) 150 MG tablet Take 0.5 tablets (75 mg total) by mouth 2 (two) times daily. 60 tablet 3  . vitamin C (ASCORBIC ACID) 500 MG tablet Take 500 mg by mouth daily.    . diclofenac sodium (VOLTAREN) 1 % GEL Apply 2 g topically 4 (four) times  daily. 100 g 1  . loratadine (CLARITIN) 10 MG tablet Take 1 tablet (10 mg total) by mouth daily. (Patient taking differently: Take 10 mg by mouth daily as needed for allergies. ) 90 tablet 3   No current facility-administered medications for this visit.     Allergies:  Allergies  Allergen Reactions  . Flagyl [Metronidazole] Rash    Past Surgical History:  Procedure Laterality Date  . CARDIAC CATHETERIZATION  06/17/2017  . LEFT HEART CATH AND CORONARY ANGIOGRAPHY N/A 06/17/2017   Procedure: LEFT HEART CATH AND CORONARY ANGIOGRAPHY;  Surgeon: Marykay LexHarding, David W, MD;  [facility];  Service: Cardiovascular;  Laterality: N/A;    Social History   Socioeconomic History  . Marital status: Married    Spouse name: Ruthine DoseLarry Roye  .  Number of children: 2  . Years of education: None  . Highest education level: None  Social Needs  . Financial resource strain: None  . Food insecurity - worry: None  . Food insecurity - inability: None  . Transportation needs - medical: None  . Transportation needs - non-medical: None  Occupational History  . Occupation: nutrition    Employer: [employer]  Tobacco Use  . Smoking status: Never Smoker  . Smokeless tobacco: Former NeurosurgeonUser    Types: Snuff  Substance and Sexual Activity  . Alcohol use: No  . Drug use: Yes    Types: "Crack" cocaine    Comment: "in the 80s"  . Sexual activity: Yes    Partners: Male  Other Topics Concern  . None  Social History Narrative  . None    Family History  Problem Relation Age of Onset  . Cancer Mother   . Diabetes Mother   . Heart disease Mother   . Hyperlipidemia Mother   . Hypertension Mother   . Stroke Mother   . Stroke Father   . Hypertension Father   . Hyperlipidemia Sister   . Stroke Sister   . Diabetes Sister   . Diabetes Sister      ROS Review of Systems See HPI Constitution: No fevers or chills No malaise No diaphoresis Skin: No rash or itching Eyes: no blurry vision, no double vision GU: no dysuria or hematuria Neuro: no dizziness or headaches all others reviewed and negative   Objective: Vitals:   09/25/17 1414  BP: 120/80  Pulse: 78  Resp: 17  Temp: 98.6 F (37 C)  TempSrc: Oral  SpO2: 95%  Weight: 173 lb 6.4 oz (78.7 kg)  Height: 5\' 2"  (1.575 m)    Physical Exam   Physical Exam  Constitutional: She is oriented to person, place, and time. She appears well-developed and well-nourished.  HENT:  Head: Normocephalic and atraumatic.  Eyes: Conjunctivae and EOM are normal.  Cardiovascular: Normal rate, regular rhythm and normal heart sounds.   Pulmonary/Chest: Effort normal and breath sounds normal. No respiratory distress. She has no wheezes.  Abdominal: Normal appearance and bowel  sounds are normal. There is no tenderness. There is no CVA tenderness.  Musculoskeletal: straight leg raise positive on the right, neg log roll, normal motor and sensory Neurological: She is alert and oriented to person, place, and time.  Extremities: no edema, cap refill <2s, no calf pain  Component     Latest Ref Rng & Units 09/25/2017 09/25/2017         3:15 PM  3:15 PM  Color, UA     yellow yellow   Clarity, UA  clear clear   Glucose     negative mg/dL negative   Bilirubin, UA     negative negative negative  Specific Gravity, UA     1.010 - 1.025 1.015   RBC, UA     negative negative   pH, UA     5.0 - 8.0 5.0   Protein, UA     negative mg/dL negative   Urobilinogen, UA     0.2 or 1.0 E.U./dL 0.2   Nitrite, UA     Negative Negative   Leukocytes, UA     Negative Trace (A)    Assessment and Plan Phylliss was seen today for leg cramps.  Diagnoses and all orders for this visit:  Leg cramps- will check for underlying causes -     POCT urinalysis dipstick -     Basic metabolic panel -     Ferritin -     CBC -     TSH  Essential hypertension- will check lytes, continue bp meds  -     Basic metabolic panel  Sciatica of right side- referral placed for PT -     Ambulatory referral to Physical Therapy -     Basic metabolic panel  Other orders -     diclofenac sodium (VOLTAREN) 1 % GEL; Apply 2 g topically 4 (four) times daily.     Dorianne Perret A Jester Klingberg

## 2017-09-25 NOTE — Patient Instructions (Addendum)
Continue your current medications  Drink plenty of water  Follow up with physical therapy  Apply diclofenac gel to back to help with pain  Do not take ibuprofen or motrin because of your recent heart attack    IF you received an x-ray today, you will receive an invoice from [facility]. Please contact [facility] at [phone] with questions or concerns regarding your invoice.   IF you received labwork today, you will receive an invoice from [place]. Please contact [facility] at [phone] with questions or concerns regarding your invoice.   Our billing staff will not be able to assist you with questions regarding bills from these companies.  You will be contacted with the lab results as soon as they are available. The fastest way to get your results is to activate your My Chart account. Instructions are located on the last page of this paperwork. If you have not heard from [place] regarding the results in 2 weeks, please contact this office.     Sciatica Sciatica is pain, numbness, weakness, or tingling along the path of the sciatic nerve. The sciatic nerve starts in the lower back and runs down the back of each leg. The nerve controls the muscles in the lower leg and in the back of the knee. It also provides feeling (sensation) to the back of the thigh, the lower leg, and the sole of the foot. Sciatica is a symptom of another medical condition that pinches or puts pressure on the sciatic nerve. Generally, sciatica only affects one side of the body. Sciatica usually goes away on its own or with treatment. In some cases, sciatica may keep coming back (recur). What are the causes? This condition is caused by pressure on the sciatic nerve, or pinching of the sciatic nerve. This may be the result of:  A disk in between the bones of the spine (vertebrae) bulging out too far (herniated disk).  Age-related changes in the spinal disks (degenerative disk disease).  A pain  disorder that affects a muscle in the buttock (piriformis syndrome).  Extra bone growth (bone spur) near the sciatic nerve.  An injury or break (fracture) of the pelvis.  Pregnancy.  Tumor (rare).  What increases the risk? The following factors may make you more likely to develop this condition:  Playing sports that place pressure or stress on the spine, such as football or weight lifting.  Having poor strength and flexibility.  A history of back injury.  A history of back surgery.  Sitting for long periods of time.  Doing activities that involve repetitive bending or lifting.  Obesity.  What are the signs or symptoms? Symptoms can vary from mild to very severe, and they may include:  Any of these problems in the lower back, leg, hip, or buttock: ? Mild tingling or dull aches. ? Burning sensations. ? Sharp pains.  Numbness in the back of the calf or the sole of the foot.  Leg weakness.  Severe back pain that makes movement difficult.  These symptoms may get worse when you cough, sneeze, or laugh, or when you sit or stand for long periods of time. Being overweight may also make symptoms worse. In some cases, symptoms may recur over time. How is this diagnosed? This condition may be diagnosed based on:  Your symptoms.  A physical exam. Your health care provider may ask you to do certain movements to check whether those movements trigger your symptoms.  You may have tests, including: ? Blood tests. ?  X-rays. ? MRI. ? CT scan.  How is this treated? In many cases, this condition improves on its own, without any treatment. However, treatment may include:  Reducing or modifying physical activity during periods of pain.  Exercising and stretching to strengthen your abdomen and improve the flexibility of your spine.  Icing and applying heat to the affected area.  Medicines that help: ? To relieve pain and swelling. ? To relax your muscles.  Injections of  medicines that help to relieve pain, irritation, and inflammation around the sciatic nerve (steroids).  Surgery.  Follow these instructions at home: Medicines  Take over-the-counter and prescription medicines only as told by your health care provider.  Do not drive or operate heavy machinery while taking prescription pain medicine. Managing pain  If directed, apply ice to the affected area. ? Put ice in a plastic bag. ? Place a towel between your skin and the bag. ? Leave the ice on for 20 minutes, 2-3 times a day.  After icing, apply heat to the affected area before you exercise or as often as told by your health care provider. Use the heat source that your health care provider recommends, such as a moist heat pack or a heating pad. ? Place a towel between your skin and the heat source. ? Leave the heat on for 20-30 minutes. ? Remove the heat if your skin turns bright red. This is especially important if you are unable to feel pain, heat, or cold. You may have a greater risk of getting burned. Activity  Return to your normal activities as told by your health care provider. Ask your health care provider what activities are safe for you. ? Avoid activities that make your symptoms worse.  Take brief periods of rest throughout the day. Resting in a lying or standing position is usually better than sitting to rest. ? When you rest for longer periods, mix in some mild activity or stretching between periods of rest. This will help to prevent stiffness and pain. ? Avoid sitting for long periods of time without moving. Get up and move around at least one time each hour.  Exercise and stretch regularly, as told by your health care provider.  Do not lift anything that is heavier than 10 lb (4.5 kg) while you have symptoms of sciatica. When you do not have symptoms, you should still avoid heavy lifting, especially repetitive heavy lifting.  When you lift objects, always use proper lifting  technique, which includes: ? Bending your knees. ? Keeping the load close to your body. ? Avoiding twisting. General instructions  Use good posture. ? Avoid leaning forward while sitting. ? Avoid hunching over while standing.  Maintain a healthy weight. Excess weight puts extra stress on your back and makes it difficult to maintain good posture.  Wear supportive, comfortable shoes. Avoid wearing high heels.  Avoid sleeping on a mattress that is too soft or too hard. A mattress that is firm enough to support your back when you sleep may help to reduce your pain.  Keep all follow-up visits as told by your health care provider. This is important. Contact a health care provider if:  You have pain that wakes you up when you are sleeping.  You have pain that gets worse when you lie down.  Your pain is worse than you have experienced in the past.  Your pain lasts longer than 4 weeks.  You experience unexplained weight loss. Get help right away if:  You lose  control of your bowel or bladder (incontinence).  You have: ? Weakness in your lower back, pelvis, buttocks, or legs that gets worse. ? Redness or swelling of your back. ? A burning sensation when you urinate. This information is not intended to replace advice given to you by your health care provider. Make sure you discuss any questions you have with your health care provider. Document Released: 08/27/2001 Document Revised: 02/06/2016 Document Reviewed: 05/12/2015 Elsevier Interactive Patient Education  [employer]2018 Elsevier Inc.

## 2017-09-26 LAB — BASIC METABOLIC PANEL
BUN / CREAT RATIO: 21 (ref 12–28)
BUN: 17 mg/dL (ref 8–27)
CO2: 21 mmol/L (ref 20–29)
CREATININE: 0.81 mg/dL (ref 0.57–1.00)
Calcium: 9.5 mg/dL (ref 8.7–10.3)
Chloride: 99 mmol/L (ref 96–106)
GFR, EST AFRICAN AMERICAN: 90 mL/min/{1.73_m2} (ref >59)
GFR, EST NON AFRICAN AMERICAN: 78 mL/min/{1.73_m2} (ref >59)
Glucose: 116 mg/dL — ABNORMAL HIGH (ref 65–99)
Potassium: 3.6 mmol/L (ref 3.5–5.2)
Sodium: 142 mmol/L (ref 134–144)

## 2017-09-26 LAB — CBC
HEMATOCRIT: 39.7 % (ref 34.0–46.6)
HEMOGLOBIN: 13.9 g/dL (ref 11.1–15.9)
MCH: 28.8 pg (ref 26.6–33.0)
MCHC: 35 g/dL (ref 31.5–35.7)
MCV: 82 fL (ref 79–97)
Platelets: 320 10*3/uL (ref 150–379)
RBC: 4.83 x10E6/uL (ref 3.77–5.28)
RDW: 15.3 % (ref 12.3–15.4)
WBC: 7 10*3/uL (ref 3.4–10.8)

## 2017-09-26 LAB — TSH: TSH: 0.738 u[IU]/mL (ref 0.450–4.500)

## 2017-09-26 LAB — FERRITIN: Ferritin: 298 ng/mL — ABNORMAL HIGH (ref 15–150)

## 2017-10-09 ENCOUNTER — Ambulatory Visit: Payer: Self-pay | Admitting: Cardiovascular Disease

## 2017-10-10 ENCOUNTER — Ambulatory Visit: Payer: BLUE CROSS/BLUE SHIELD | Admitting: Cardiology

## 2017-10-10 ENCOUNTER — Encounter: Payer: Self-pay | Admitting: Cardiology

## 2017-10-10 VITALS — BP 118/76 | HR 64 | Ht 62.0 in | Wt 176.2 lb

## 2017-10-10 DIAGNOSIS — I252 Old myocardial infarction: Secondary | ICD-10-CM

## 2017-10-10 DIAGNOSIS — E785 Hyperlipidemia, unspecified: Secondary | ICD-10-CM | POA: Diagnosis not present

## 2017-10-10 DIAGNOSIS — I10 Essential (primary) hypertension: Secondary | ICD-10-CM | POA: Diagnosis not present

## 2017-10-10 DIAGNOSIS — I2510 Atherosclerotic heart disease of native coronary artery without angina pectoris: Secondary | ICD-10-CM | POA: Diagnosis not present

## 2017-10-10 NOTE — Assessment & Plan Note (Signed)
NSTEMI- Troponin 0.25- Oct 2018

## 2017-10-10 NOTE — Assessment & Plan Note (Signed)
LDL was 70 Nov 2018 on statin Rx She recently had pain in her thighs and thought it was from back problems. Its better now but she also tells me she had thigh pain with Lipitor in the past.

## 2017-10-10 NOTE — Patient Instructions (Signed)
Medication Instructions: Your physician recommends that you continue on your current medications as directed. Please refer to the Current Medication list given to you today.  If you need a refill on your cardiac medications before your next appointment, please call your pharmacy.    Follow-Up: Your physician wants you to follow-up in: 6 months with Dr. Royann Shiversroitoru. You will receive a reminder letter in the mail two months in advance. If you don't receive a letter, please call our office at [phone] to schedule this follow-up appointment.   Special Instructions: Call if you have recurrent thigh pain.    Thank you [facility]!!

## 2017-10-10 NOTE — Assessment & Plan Note (Signed)
55% ostial CFX  Oct 2018

## 2017-10-10 NOTE — Progress Notes (Signed)
10/10/2017 Casey Peterson   06/30/1956  [id number]  Primary Physician Doristine Bosworth, MD Primary Cardiologist: Dr Royann Shivers  HPI:  62 y/o female who had a NSTEMI in October 2018. Cath showed 55% ostial CFX disease and normal LVF. The plan has been for medical Rx. She has done well. She recently complained to her PCP of thigh pain which she attributed it to back DJD. It resolved without treatment. She admits she had problems with thigh pain from Lipitor in the past.    Current Outpatient Medications  Medication Sig Dispense Refill  . aspirin EC 81 MG EC tablet Take 1 tablet (81 mg total) by mouth daily.    Marland Kitchen atorvastatin (LIPITOR) 40 MG tablet Take 1 tablet (40 mg total) by mouth daily at 6 PM. 30 tablet 11  . clopidogrel (PLAVIX) 75 MG tablet Take 1 tablet (75 mg total) by mouth daily. 30 tablet 11  . diclofenac sodium (VOLTAREN) 1 % GEL Apply 2 g topically 4 (four) times daily. 100 g 1  . fluticasone (FLONASE) 50 MCG/ACT nasal spray Place 2 sprays into both nostrils daily. (Patient taking differently: Place 2 sprays into both nostrils daily as needed for allergies. ) 16 g 9  . hydrochlorothiazide (HYDRODIURIL) 25 MG tablet Take 0.5 tablets (12.5 mg total) by mouth daily. 90 tablet 3  . loratadine (CLARITIN) 10 MG tablet Take 1 tablet (10 mg total) by mouth daily. (Patient taking differently: Take 10 mg by mouth daily as needed for allergies. ) 90 tablet 3  . losartan (COZAAR) 50 MG tablet Take 1 tablet (50 mg total) by mouth daily. 30 tablet 11  . meclizine (ANTIVERT) 25 MG tablet Take 25 mg by mouth 3 (three) times daily as needed for dizziness.    . Multiple Vitamin (MULTIVITAMIN WITH MINERALS) TABS tablet Take 1 tablet by mouth daily.    . ranitidine (ZANTAC) 150 MG tablet Take 0.5 tablets (75 mg total) by mouth 2 (two) times daily. 60 tablet 3  . vitamin C (ASCORBIC ACID) 500 MG tablet Take 500 mg by mouth daily.     No current facility-administered medications for this visit.      Allergies  Allergen Reactions  . Flagyl [Metronidazole] Rash    Past Medical History:  Diagnosis Date  . Allergy   . CAD (coronary artery disease)    a. 06/2017: NSTEMI with cath showing 55% Ost Ramus stenosis (too small for PCI). LVEF normal.   . Cataract   . GERD (gastroesophageal reflux disease)   . Headache   . Herniated disc, cervical   . Hypercholesteremia   . Hypertension     Social History   Socioeconomic History  . Marital status: Married    Spouse name: Chester Romero  . Number of children: 2  . Years of education: Not on file  . Highest education level: Not on file  Social Needs  . Financial resource strain: Not on file  . Food insecurity - worry: Not on file  . Food insecurity - inability: Not on file  . Transportation needs - medical: Not on file  . Transportation needs - non-medical: Not on file  Occupational History  . Occupation: nutrition    Employer: [employer]  Tobacco Use  . Smoking status: Never Smoker  . Smokeless tobacco: Former Neurosurgeon    Types: Snuff  Substance and Sexual Activity  . Alcohol use: No  . Drug use: Yes    Types: "Crack" cocaine    Comment: "in  the 80s"  . Sexual activity: Yes    Partners: Male  Other Topics Concern  . Not on file  Social History Narrative  . Not on file     Family History  Problem Relation Age of Onset  . Cancer Mother   . Diabetes Mother   . Heart disease Mother   . Hyperlipidemia Mother   . Hypertension Mother   . Stroke Mother   . Stroke Father   . Hypertension Father   . Hyperlipidemia Sister   . Stroke Sister   . Diabetes Sister   . Diabetes Sister      Review of Systems: General: negative for chills, fever, night sweats or weight changes.  Cardiovascular: negative for chest pain, dyspnea on exertion, edema, orthopnea, palpitations, paroxysmal nocturnal dyspnea or shortness of breath Dermatological: negative for rash Respiratory: negative for cough or wheezing Urologic:  negative for hematuria Abdominal: negative for nausea, vomiting, diarrhea, bright red blood per rectum, melena, or hematemesis Neurologic: negative for visual changes, syncope, or dizziness All other systems reviewed and are otherwise negative except as noted above.    Blood pressure 118/76, pulse 64, height 5\' 2"  (1.575 m), weight 176 lb 3.2 oz (79.9 kg), SpO2 98 %.  General appearance: alert, cooperative and no distress Neck: no carotid bruit and no JVD Lungs: clear to auscultation bilaterally Heart: regular rate and rhythm Extremities: extremities normal, atraumatic, no cyanosis or edema Skin: Skin color, texture, turgor normal. No rashes or lesions Neurologic: Grossly normal   ASSESSMENT AND PLAN:   History of non-ST elevation myocardial infarction (NSTEMI) NSTEMI- Troponin 0.25- Oct 2018  CAD (coronary artery disease) 55% ostial CFX  Oct 2018  Hyperlipidemia LDL goal <70 LDL was 70 Nov 2018 on statin Rx She recently had pain in her thighs and thought it was from back problems. Its better now but she also tells me she had thigh pain with Lipitor in the past.   Essential hypertension Controlled   PLAN  I offered to change her to Crestor but she wants to continue Lipitor for now. She'll let us know if she has recurrent thigh pain. F/U 6 months.   Corine ShelterLuke Kamee Bobst PA-C 10/10/2017 2:08 PM

## 2017-10-10 NOTE — Assessment & Plan Note (Signed)
Controlled.  

## 2017-10-15 ENCOUNTER — Encounter: Payer: Self-pay | Admitting: Family Medicine

## 2017-10-16 ENCOUNTER — Telehealth: Payer: Self-pay

## 2017-10-16 NOTE — Telephone Encounter (Signed)
Lab results sent via mail to pt home address on file. Dgaddy, CMA 

## 2017-11-03 ENCOUNTER — Telehealth: Payer: Self-pay | Admitting: Cardiology

## 2017-11-03 NOTE — Telephone Encounter (Signed)
New Message   Patient states that she has a cold. She wants to know can she take alka seltzer or mucinex. She is worried that it may interfer with her medication.

## 2017-11-03 NOTE — Telephone Encounter (Signed)
Left message for patient, mucinex is fine and also claritin and saline nasal spray.

## 2017-12-18 ENCOUNTER — Ambulatory Visit: Payer: BLUE CROSS/BLUE SHIELD | Admitting: Family Medicine

## 2017-12-18 ENCOUNTER — Encounter: Payer: Self-pay | Admitting: Family Medicine

## 2017-12-18 VITALS — BP 120/76 | HR 82 | Temp 98.4°F | Resp 17 | Ht 62.0 in | Wt 177.6 lb

## 2017-12-18 DIAGNOSIS — N390 Urinary tract infection, site not specified: Secondary | ICD-10-CM

## 2017-12-18 DIAGNOSIS — R300 Dysuria: Secondary | ICD-10-CM

## 2017-12-18 LAB — POCT URINALYSIS DIP (MANUAL ENTRY)
BILIRUBIN UA: NEGATIVE mg/dL
Bilirubin, UA: NEGATIVE
Glucose, UA: NEGATIVE mg/dL
NITRITE UA: NEGATIVE
PH UA: 5.5 (ref 5.0–8.0)
Protein Ur, POC: NEGATIVE mg/dL
Spec Grav, UA: 1.02 (ref 1.010–1.025)
UROBILINOGEN UA: 0.2 U/dL

## 2017-12-18 MED ORDER — NITROFURANTOIN MONOHYD MACRO 100 MG PO CAPS: 100.0000 mg | ORAL_CAPSULE | Freq: Two times a day (BID) | ORAL | 0 refills | Status: AC

## 2017-12-18 NOTE — Progress Notes (Signed)
Chief Complaint  Patient presents with  . Urinary Tract Infection    x 3 days with burning and pain    HPI  Urinary Tract Infection: Patient complains of dysuria, frequency, suprapubic pressure and urgency She has had symptoms for 3 days. Patient also complains of back pain. Patient denies cough, fever, headache, rhinitis and vaginal discharge. Patient does not have Casey history of recurrent UTI.  Patient does not have Casey history of pyelonephritis.    Past Medical History:  Diagnosis Date  . Allergy   . CAD (coronary artery disease)    Casey. 06/2017: NSTEMI with cath showing 55% Ost Ramus stenosis (too small for PCI). LVEF normal.   . Cataract   . GERD (gastroesophageal reflux disease)   . Headache   . Herniated disc, cervical   . Hypercholesteremia   . Hypertension     Current Outpatient Medications  Medication Sig Dispense Refill  . aspirin EC 81 MG EC tablet Take 1 tablet (81 mg total) by mouth daily.    Marland Kitchen. atorvastatin (LIPITOR) 40 MG tablet Take 1 tablet (40 mg total) by mouth daily at 6 PM. 30 tablet 11  . clopidogrel (PLAVIX) 75 MG tablet Take 1 tablet (75 mg total) by mouth daily. 30 tablet 11  . diclofenac sodium (VOLTAREN) 1 % GEL Apply 2 g topically 4 (four) times daily. 100 g 1  . fluticasone (FLONASE) 50 MCG/ACT nasal spray Place 2 sprays into both nostrils daily. (Patient taking differently: Place 2 sprays into both nostrils daily as needed for allergies. ) 16 g 9  . hydrochlorothiazide (HYDRODIURIL) 25 MG tablet Take 0.5 tablets (12.5 mg total) by mouth daily. 90 tablet 3  . loratadine (CLARITIN) 10 MG tablet Take 1 tablet (10 mg total) by mouth daily. (Patient taking differently: Take 10 mg by mouth daily as needed for allergies. ) 90 tablet 3  . losartan (COZAAR) 50 MG tablet Take 1 tablet (50 mg total) by mouth daily. 30 tablet 11  . meclizine (ANTIVERT) 25 MG tablet Take 25 mg by mouth 3 (three) times daily as needed for dizziness.    . Multiple Vitamin (MULTIVITAMIN  WITH MINERALS) TABS tablet Take 1 tablet by mouth daily.    . ranitidine (ZANTAC) 150 MG tablet Take 0.5 tablets (75 mg total) by mouth 2 (two) times daily. 60 tablet 3  . vitamin C (ASCORBIC ACID) 500 MG tablet Take 500 mg by mouth daily.     No current facility-administered medications for this visit.     Allergies:  Allergies  Allergen Reactions  . Flagyl [Metronidazole] Rash    Past Surgical History:  Procedure Laterality Date  . CARDIAC CATHETERIZATION  06/17/2017  . LEFT HEART CATH AND CORONARY ANGIOGRAPHY N/Casey 06/17/2017   Procedure: LEFT HEART CATH AND CORONARY ANGIOGRAPHY;  Surgeon: Marykay LexHarding, David W, MD;  [facility];  Service: Cardiovascular;  Laterality: N/Casey;    Social History   Socioeconomic History  . Marital status: Married    Spouse name: Ruthine DoseLarry Ferdinand  . Number of children: 2  . Years of education: Not on file  . Highest education level: Not on file  Occupational History  . Occupation: nutrition    Employer: [employer]  Social Needs  . Financial resource strain: Not on file  . Food insecurity:    Worry: Not on file    Inability: Not on file  . Transportation needs:    Medical: Not on file    Non-medical: Not on file  Tobacco Use  . Smoking status: Never Smoker  . Smokeless tobacco: Former Neurosurgeon    Types: Snuff  Substance and Sexual Activity  . Alcohol use: No  . Drug use: Yes    Types: "Crack" cocaine    Comment: "in the 80s"  . Sexual activity: Yes    Partners: Male  Lifestyle  . Physical activity:    Days per week: Not on file    Minutes per session: Not on file  . Stress: Not on file  Relationships  . Social connections:    Talks on phone: Not on file    Gets together: Not on file    Attends religious service: Not on file    Active member of club or organization: Not on file    Attends meetings of clubs or organizations: Not on file    Relationship status: Not on file  Other Topics Concern  . Not on file    Social History Narrative  . Not on file    Family History  Problem Relation Age of Onset  . Cancer Mother   . Diabetes Mother   . Heart disease Mother   . Hyperlipidemia Mother   . Hypertension Mother   . Stroke Mother   . Stroke Father   . Hypertension Father   . Hyperlipidemia Sister   . Stroke Sister   . Diabetes Sister   . Diabetes Sister      ROS Review of Systems See HPI Constitution: No fevers or chills No malaise No diaphoresis Skin: No rash or itching Eyes: no blurry vision, no double vision GU: no dysuria or hematuria Neuro: no dizziness or headaches * all others reviewed and negative   Objective: Vitals:   12/18/17 1540  BP: 120/76  Pulse: 82  Resp: 17  Temp: 98.4 F (36.9 C)  TempSrc: Oral  SpO2: 97%  Weight: 177 lb 9.6 oz (80.6 kg)  Height: 5\' 2"  (1.575 m)    Physical Exam Physical Exam  Constitutional: She is oriented to person, place, and time. She appears well-developed and well-nourished.  HENT:  Head: Normocephalic and atraumatic.  Eyes: Conjunctivae and EOM are normal.  Cardiovascular: Normal rate, regular rhythm and normal heart sounds.   Pulmonary/Chest: Effort normal and breath sounds normal. No respiratory distress. She has no wheezes.  Abdominal: Normal appearance and bowel sounds are normal. There is suprapubic tenderness. There is no CVA tenderness.  Neurological: She is alert and oriented to person, place, and time.   Component     Latest Ref Rng & Units 12/18/2017 12/18/2017         3:53 PM  3:53 PM  Color, UA     yellow yellow   Clarity, UA     clear cloudy (Casey)   Glucose     negative mg/dL negative   Bilirubin, UA     negative negative negative  Specific Gravity, UA     1.010 - 1.025 1.020   RBC, UA     negative trace-intact (Casey)   pH, UA     5.0 - 8.0 5.5   Protein, UA     negative mg/dL negative   Urobilinogen, UA     0.2 or 1.0 E.U./dL 0.2   Nitrite, UA     Negative Negative   Leukocytes, UA     Negative  Moderate (2+) (Casey)    Assessment and Plan Rolene was seen today for urinary tract infection.  Diagnoses and all orders for this visit:  Dysuria -  POCT urinalysis dipstick -     Urine Culture  Acute UTI -     Urine Culture   Will treat empirically with Macrobid Await cultures  Casey Peterson Casey Peterson

## 2017-12-18 NOTE — Patient Instructions (Addendum)
   IF you received an x-ray today, you will receive an invoice from [facility]. Please contact [facility] at [phone] with questions or concerns regarding your invoice.   IF you received labwork today, you will receive an invoice from [facility]. Please contact [facility] at [phone] with questions or concerns regarding your invoice.   Our billing staff will not be able to assist you with questions regarding bills from these companies.  You will be contacted with the lab results as soon as they are available. The fastest way to get your results is to activate your My Chart account. Instructions are located on the last page of this paperwork. If you have not heard from us regarding the results in 2 weeks, please contact this office.      Urinary Tract Infection, Adult A urinary tract infection (UTI) is an infection of any part of the urinary tract, which includes the kidneys, ureters, bladder, and urethra. These organs make, store, and get rid of urine in the body. UTI can be a bladder infection (cystitis) or kidney infection (pyelonephritis). What are the causes? This infection may be caused by fungi, viruses, or bacteria. Bacteria are the most common cause of UTIs. This condition can also be caused by repeated incomplete emptying of the bladder during urination. What increases the risk? This condition is more likely to develop if:  You ignore your need to urinate or hold urine for long periods of time.  You do not empty your bladder completely during urination.  You wipe back to front after urinating or having a bowel movement, if you are female.  You are uncircumcised, if you are female.  You are constipated.  You have a urinary catheter that stays in place (indwelling).  You have a weak defense (immune) system.  You have a medical condition that affects your bowels, kidneys, or bladder.  You have diabetes.  You take antibiotic medicines frequently or  for long periods of time, and the antibiotics no longer work well against certain types of infections (antibiotic resistance).  You take medicines that irritate your urinary tract.  You are exposed to chemicals that irritate your urinary tract.  You are female.  What are the signs or symptoms? Symptoms of this condition include:  Fever.  Frequent urination or passing small amounts of urine frequently.  Needing to urinate urgently.  Pain or burning with urination.  Urine that smells bad or unusual.  Cloudy urine.  Pain in the lower abdomen or back.  Trouble urinating.  Blood in the urine.  Vomiting or being less hungry than normal.  Diarrhea or abdominal pain.  Vaginal discharge, if you are female.  How is this diagnosed? This condition is diagnosed with a medical history and physical exam. You will also need to provide a urine sample to test your urine. Other tests may be done, including:  Blood tests.  Sexually transmitted disease (STD) testing.  If you have had more than one UTI, a cystoscopy or imaging studies may be done to determine the cause of the infections. How is this treated? Treatment for this condition often includes a combination of two or more of the following:  Antibiotic medicine.  Other medicines to treat less common causes of UTI.  Over-the-counter medicines to treat pain.  Drinking enough water to stay hydrated.  Follow these instructions at home:  Take over-the-counter and prescription medicines only as told by your health care provider.  If you were prescribed an antibiotic, take it as   told by your health care provider. Do not stop taking the antibiotic even if you start to feel better.  Avoid alcohol, caffeine, tea, and carbonated beverages. They can irritate your bladder.  Drink enough fluid to keep your urine clear or pale yellow.  Keep all follow-up visits as told by your health care provider. This is important.  Make sure  to: ? Empty your bladder often and completely. Do not hold urine for long periods of time. ? Empty your bladder before and after sex. ? Wipe from front to back after a bowel movement if you are female. Use each tissue one time when you wipe. Contact a health care provider if:  You have back pain.  You have a fever.  You feel nauseous or vomit.  Your symptoms do not get better after 3 days.  Your symptoms go away and then return. Get help right away if:  You have severe back pain or lower abdominal pain.  You are vomiting and cannot keep down any medicines or water. This information is not intended to replace advice given to you by your health care provider. Make sure you discuss any questions you have with your health care provider. Document Released: 06/12/2005 Document Revised: 02/14/2016 Document Reviewed: 07/24/2015 Elsevier Interactive Patient Education  2018 Elsevier Inc.  

## 2017-12-20 LAB — URINE CULTURE

## 2018-01-16 ENCOUNTER — Other Ambulatory Visit: Payer: Self-pay | Admitting: Family Medicine

## 2018-01-16 DIAGNOSIS — J302 Other seasonal allergic rhinitis: Secondary | ICD-10-CM

## 2018-01-19 NOTE — Telephone Encounter (Signed)
Flonase nasal spray refill request  LOV 01/14/17 with Stallings  Last refill:  01/14/17  [id number] - [place], [place] - [phone]

## 2018-01-22 ENCOUNTER — Other Ambulatory Visit: Payer: Self-pay

## 2018-01-22 MED ORDER — RANITIDINE HCL 150 MG PO TABS: 75.0000 mg | ORAL_TABLET | Freq: Two times a day (BID) | ORAL | 5 refills | Status: DC

## 2018-03-23 ENCOUNTER — Encounter: Payer: Self-pay | Admitting: Family Medicine

## 2018-03-23 ENCOUNTER — Ambulatory Visit (INDEPENDENT_AMBULATORY_CARE_PROVIDER_SITE_OTHER): Payer: BLUE CROSS/BLUE SHIELD | Admitting: Family Medicine

## 2018-03-23 VITALS — BP 115/82 | HR 90 | Temp 98.2°F | Resp 16 | Ht 61.5 in | Wt 177.2 lb

## 2018-03-23 DIAGNOSIS — Z0000 Encounter for general adult medical examination without abnormal findings: Secondary | ICD-10-CM | POA: Diagnosis not present

## 2018-03-23 DIAGNOSIS — Z124 Encounter for screening for malignant neoplasm of cervix: Secondary | ICD-10-CM

## 2018-03-23 DIAGNOSIS — Z131 Encounter for screening for diabetes mellitus: Secondary | ICD-10-CM | POA: Diagnosis not present

## 2018-03-23 DIAGNOSIS — Z1159 Encounter for screening for other viral diseases: Secondary | ICD-10-CM

## 2018-03-23 DIAGNOSIS — Z1239 Encounter for other screening for malignant neoplasm of breast: Secondary | ICD-10-CM

## 2018-03-23 DIAGNOSIS — Z1231 Encounter for screening mammogram for malignant neoplasm of breast: Secondary | ICD-10-CM

## 2018-03-23 DIAGNOSIS — Z1211 Encounter for screening for malignant neoplasm of colon: Secondary | ICD-10-CM | POA: Diagnosis not present

## 2018-03-23 NOTE — Patient Instructions (Addendum)
We recommend that you schedule a mammogram for breast cancer screening. Typically, you do not need a referral to do this. Please contact a local imaging center to schedule your mammogram.  [facility] - [phone]  *ask for the Radiology Department [facility]) - [phone] or [phone]  [facility] - [phone] [facility] - [phone] [facility] - [phone]  *ask for the Radiology Department [facility] - [phone]  *ask for the Radiology Department [facility] - [phone]  *ask for the Mammography Department [facility] - [phone]     IF you received an x-ray today, you will receive an invoice from [facility]. Please contact [facility] at [phone] with questions or concerns regarding your invoice.   IF you received labwork today, you will receive an invoice from [facility]. Please contact [facility] at [phone] with questions or concerns regarding your invoice.   Our billing staff will not be able to assist you with questions regarding bills from these companies.  You will be contacted with the lab results as soon as they are available. The fastest way to get your results is to activate your My Chart account. Instructions are located on the last page of this paperwork. If you have not heard from us regarding the results in 2 weeks, please contact this office.      

## 2018-03-23 NOTE — Progress Notes (Signed)
Chief Complaint  Patient presents with  . Annual Exam    cpe with pap    Subjective:  Casey Peterson is a 62 y.o. female here for a health maintenance visit.  Patient is established pt   Colon Cancer Screening SHe has never had a colonoscopy SHe denies blood in his stool, unexpected weight loss or pain with defecation No rectal itching SHe does not have a family history of colon cancer   Patient Active Problem List   Diagnosis Date Noted  . Herniated disc, cervical   . Headache   . GERD (gastroesophageal reflux disease)   . Cataract   . Allergy   . CAD (coronary artery disease) 07/01/2017  . 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 History of non-ST elevation myocardial infarction (NSTEMI) 06/16/2017  . Tinnitus of both ears 01/14/2017  . Migraine without aura and without status migrainosus, not intractable 01/14/2017  . Essential hypertension 12/14/2014  . Hyperlipidemia LDL goal <70 12/14/2014    Past Medical History:  Diagnosis Date  . Allergy   . CAD (coronary artery disease)    a. 06/2017: NSTEMI with cath showing 55% Ost Ramus stenosis (too small for PCI). LVEF normal.   . Cataract   . GERD (gastroesophageal reflux disease)   . Headache   . Herniated disc, cervical   . Hypercholesteremia   . Hypertension     Past Surgical History:  Procedure Laterality Date  . CARDIAC CATHETERIZATION  06/17/2017  . LEFT HEART CATH AND CORONARY ANGIOGRAPHY N/A 06/17/2017   Procedure: LEFT HEART CATH AND CORONARY ANGIOGRAPHY;  Surgeon: Marykay Lex, MD;  Location: [facility] INVASIVE CV LAB;  Service: Cardiovascular;  Laterality: N/A;     Outpatient Medications Prior to Visit  Medication Sig Dispense Refill  . aspirin EC 81 MG EC tablet Take 1 tablet (81 mg total) by mouth daily.    Marland Kitchen atorvastatin (LIPITOR) 40 MG tablet Take 1 tablet (40 mg total) by mouth daily at 6 PM. 30 tablet 11  . clopidogrel (PLAVIX) 75 MG tablet Take 1 tablet (75 mg total) by mouth daily. 30 tablet 11  .  diclofenac sodium (VOLTAREN) 1 % GEL Apply 2 g topically 4 (four) times daily. 100 g 1  . fluticasone (FLONASE) 50 MCG/ACT nasal spray Place 2 sprays into both nostrils daily. (Patient taking differently: Place 2 sprays into both nostrils daily as needed for allergies. ) 16 g 9  . hydrochlorothiazide (HYDRODIURIL) 25 MG tablet Take 0.5 tablets (12.5 mg total) by mouth daily. 90 tablet 3  . loratadine (CLARITIN) 10 MG tablet Take 1 tablet (10 mg total) by mouth daily. (Patient taking differently: Take 10 mg by mouth daily as needed for allergies. ) 90 tablet 3  . losartan (COZAAR) 50 MG tablet Take 1 tablet (50 mg total) by mouth daily. 30 tablet 11  . Multiple Vitamin (MULTIVITAMIN WITH MINERALS) TABS tablet Take 1 tablet by mouth daily.    . ranitidine (ZANTAC) 150 MG tablet Take 0.5 tablets (75 mg total) by mouth 2 (two) times daily. 60 tablet 5  . vitamin C (ASCORBIC ACID) 500 MG tablet Take 500 mg by mouth daily.    . meclizine (ANTIVERT) 25 MG tablet Take 25 mg by mouth 3 (three) times daily as needed for dizziness.     No facility-administered medications prior to visit.     Allergies  Allergen Reactions  . Flagyl [Metronidazole] Rash     Family History  Problem Relation Age of Onset  . Cancer Mother   .  Diabetes Mother   . Heart disease Mother   . Hyperlipidemia Mother   . Hypertension Mother   . Stroke Mother   . Stroke Father   . Hypertension Father   . Hyperlipidemia Sister   . Stroke Sister   . Diabetes Sister   . Diabetes Sister      Health Habits: Dental Exam: up to date Eye Exam: up to date Exercise: 5-7 times/week on average Current exercise activities: walking/running Diet: balanced  Social History   Socioeconomic History  . Marital status: Married    Spouse name: Aunisty Reali  . Number of children: 2  . Years of education: Not on file  . Highest education level: Not on file  Occupational History  . Occupation: nutrition    Employer: [employer]  Social Needs  . Financial resource strain: Not on file  . Food insecurity:    Worry: Not on file    Inability: Not on file  . Transportation needs:    Medical: Not on file    Non-medical: Not on file  Tobacco Use  . Smoking status: Never Smoker  . Smokeless tobacco: Former Neurosurgeon    Types: Snuff  Substance and Sexual Activity  . Alcohol use: No  . Drug use: Yes    Types: "Crack" cocaine    Comment: "in the 80s"  . Sexual activity: Yes    Partners: Male  Lifestyle  . Physical activity:    Days per week: Not on file    Minutes per session: Not on file  . Stress: Not on file  Relationships  . Social connections:    Talks on phone: Not on file    Gets together: Not on file    Attends religious service: Not on file    Active member of club or organization: Not on file    Attends meetings of clubs or organizations: Not on file    Relationship status: Not on file  . Intimate partner violence:    Fear of current or ex partner: Not on file    Emotionally abused: Not on file    Physically abused: Not on file    Forced sexual activity: Not on file  Other Topics Concern  . Not on file  Social History Narrative  . Not on file   Social History   Substance and Sexual Activity  Alcohol Use No   Social History   Tobacco Use  Smoking Status Never Smoker  Smokeless Tobacco Former Neurosurgeon  . Types: Snuff   Social History   Substance and Sexual Activity  Drug Use Yes  . Types: "Crack" cocaine   Comment: "in the 80s"    GYN: Sexual Health Menstrual status: regular menses LMP: No LMP recorded. Patient is postmenopausal. Last pap smear: see HM section History of abnormal pap smears:   Health Maintenance: See under health Maintenance activity for review of completion dates as well. Immunization History  Administered Date(s) Administered  . Influenza,inj,Quad PF,6+ Mos 07/25/2016, 06/17/2017  . PPD Test 10/19/2013  . Tdap 04/11/2014      Depression  Screen-PHQ2/9 Depression screen [facility] 2/9 03/23/2018 12/18/2017 09/25/2017 09/25/2017 01/14/2017  Decreased Interest 0 0 0 0 0  Down, Depressed, Hopeless 0 0 0 0 1  PHQ - 2 Score 0 0 0 0 1       Depression Severity and Treatment Recommendations:  0-4= None  5-9= Mild / Treatment: Support, educate to call if worse; return in one month  10-14= Moderate /  Treatment: Support, watchful waiting; Antidepressant or Psycotherapy  15-19= Moderately severe / Treatment: Antidepressant OR Psychotherapy  >= 20 = Major depression, severe / Antidepressant AND Psychotherapy    Review of Systems   Review of Systems  Constitutional: Negative for chills and fever.  HENT: Negative for congestion, hearing loss and nosebleeds.   Respiratory: Negative for cough, shortness of breath and wheezing.   Cardiovascular: Negative for chest pain and palpitations.  Gastrointestinal: Negative for abdominal pain, diarrhea, nausea and vomiting.  Genitourinary: Negative for dysuria, frequency and urgency.  Musculoskeletal: Negative for myalgias and neck pain.  Skin: Negative for itching and rash.  Neurological: Negative for dizziness, tingling, tremors and headaches.  Psychiatric/Behavioral: Negative for depression. The patient is not nervous/anxious and does not have insomnia.      Objective:   Vitals:   03/23/18 1114  BP: 115/82  Pulse: 90  Resp: 16  Temp: 98.2 F (36.8 C)  TempSrc: Oral  SpO2: 95%  Weight: 177 lb 3.2 oz (80.4 kg)  Height: 5' 1.5" (1.562 m)   Wt Readings from Last 3 Encounters:  03/23/18 177 lb 3.2 oz (80.4 kg)  12/18/17 177 lb 9.6 oz (80.6 kg)  10/10/17 176 lb 3.2 oz (79.9 kg)    Body mass index is 32.94 kg/m.  Physical Exam  BP 115/82 (BP Location: Right Arm, Patient Position: Sitting, Cuff Size: Normal)   Pulse 90   Temp 98.2 F (36.8 C) (Oral)   Resp 16   Ht 5' 1.5" (1.562 m)   Wt 177 lb 3.2 oz (80.4 kg)   SpO2 95%   BMI 32.94 kg/m   Chaperone present   General  Appearance:    Alert, cooperative, no distress, appears stated age  Head:    Normocephalic, without obvious abnormality, atraumatic  Eyes:    PERRL, conjunctiva/corneas clear, EOM's intact, fundi    benign, both eyes  Ears:    Normal TM's and external ear canals, both ears  Nose:   Nares normal, septum midline, mucosa normal, no drainage    or sinus tenderness  Throat:   Lips, mucosa, and tongue normal; teeth and gums normal  Neck:   Supple, symmetrical, trachea midline, no adenopathy;    thyroid:  no enlargement/tenderness/nodules; no carotid   bruit or JVD  Back:     Symmetric, no curvature, ROM normal, no CVA tenderness  Lungs:     Clear to auscultation bilaterally, respirations unlabored  Chest Wall:    No tenderness or deformity   Heart:    Regular rate and rhythm, S1 and S2 normal, no murmur, rub   or gallop  Breast Exam:    No tenderness, masses, or nipple abnormality  Abdomen:     Soft, non-tender, bowel sounds active all four quadrants,    no masses, no organomegaly  Genitalia:    Normal female without lesion, discharge or tenderness, pap smear performed, uterus midline, no palpable masses  Extremities:   Extremities normal, atraumatic, no cyanosis or edema  Pulses:   2+ and symmetric all extremities  Skin:   Skin color, texture, turgor normal, no rashes or lesions  Lymph nodes:   Cervical, supraclavicular, and axillary nodes normal  Neurologic:   CNII-XII intact, normal strength, sensation and reflexes    throughout      Assessment/Plan:   Patient was seen for a health maintenance exam.  Counseled the patient on health maintenance issues. Reviewed her health mainteance schedule and ordered appropriate tests (see orders.) Counseled on regular exercise and  weight management. Recommend regular eye exams and dental cleaning.   The following issues were addressed today for health maintenance:   Corrie DandyMary was seen today for annual exam.  Diagnoses and all orders for this  visit:  Health maintenance examination Women's Health Maintenance Plan Advised monthly breast exam and annual mammogram Advised dental exam every six months Discussed stress management Discussed pap smear screening guidelines   Pap smear for cervical cancer screening -     Pap IG, CT/NG NAA, and HPV (high risk) [facility] Discussed pap guidelines  Special screening for malignant neoplasms, colon -     Ambulatory referral to Gastroenterology  Screening for diabetes mellitus -     Comprehensive metabolic panel -     Hemoglobin A1c  Encounter for hepatitis C screening test for low risk patient -     HCV Ab w/Rflx to Verification  Breast cancer screening -     MM Digital Screening; Future  Other orders -     Interpretation:    No follow-ups on file.    Body mass index is 32.94 kg/m.:  Discussed the patient's BMI with patient. The BMI body mass index is 32.94 kg/m.     Future Appointments  Date Time Provider Department Center  04/20/2018  8:30 AM Zehr, Princella PellegriniJessica D, PA-C [facility]  09/28/2018  8:00 AM Doristine BosworthStallings, Yariah Selvey A, MD PCP-PCP [facility]    Patient Instructions    We recommend that you schedule a mammogram for breast cancer screening. Typically, you do not need a referral to do this. Please contact a local imaging center to schedule your mammogram.  [facility] - [phone]  *ask for the Radiology Department [facility] - [phone] or [phone]  [facility] - [phone] [facility] - [phone] [facility] - [phone]  *ask for the Radiology Department [facility] - [phone]  *ask for the Radiology Department [facility] - [phone]  *ask for the Mammography Department [facility] - [phone]) [phone]    IF you received an x-ray today, you will receive an invoice from [facility]. Please contact [facility] at  [phone] with questions or concerns regarding your invoice.   IF you received labwork today, you will receive an invoice from [facility]. Please contact [facility] at [phone] with questions or concerns regarding your invoice.   Our billing staff will not be able to assist you with questions regarding bills from these companies.  You will be contacted with the lab results as soon as they are available. The fastest way to get your results is to activate your My Chart account. Instructions are located on the last page of this paperwork. If you have not heard from us regarding the results in 2 weeks, please contact this office.

## 2018-03-24 ENCOUNTER — Encounter: Payer: Self-pay | Admitting: Gastroenterology

## 2018-03-24 ENCOUNTER — Encounter: Payer: Self-pay | Admitting: Family Medicine

## 2018-03-24 LAB — HEMOGLOBIN A1C
ESTIMATED AVERAGE GLUCOSE: 131 mg/dL
HEMOGLOBIN A1C: 6.2 % — AB (ref 4.8–5.6)

## 2018-03-24 LAB — COMPREHENSIVE METABOLIC PANEL
A/G RATIO: 1.5 (ref 1.2–2.2)
ALK PHOS: 59 IU/L (ref 39–117)
ALT: 27 IU/L (ref 0–32)
AST: 24 IU/L (ref 0–40)
Albumin: 4.7 g/dL (ref 3.6–4.8)
BUN/Creatinine Ratio: 14 (ref 12–28)
BUN: 13 mg/dL (ref 8–27)
Bilirubin Total: 0.4 mg/dL (ref 0.0–1.2)
CO2: 25 mmol/L (ref 20–29)
CREATININE: 0.91 mg/dL (ref 0.57–1.00)
Calcium: 10.3 mg/dL (ref 8.7–10.3)
Chloride: 96 mmol/L (ref 96–106)
GFR calc Af Amer: 78 mL/min/{1.73_m2} (ref >59)
GFR calc non Af Amer: 67 mL/min/{1.73_m2} (ref >59)
GLOBULIN, TOTAL: 3.2 g/dL (ref 1.5–4.5)
Glucose: 100 mg/dL — ABNORMAL HIGH (ref 65–99)
POTASSIUM: 4 mmol/L (ref 3.5–5.2)
SODIUM: 137 mmol/L (ref 134–144)
Total Protein: 7.9 g/dL (ref 6.0–8.5)

## 2018-03-24 LAB — HCV AB W/RFLX TO VERIFICATION: HCV Ab: <0.1 s/co ratio (ref 0.0–0.9)

## 2018-03-24 LAB — HCV INTERPRETATION

## 2018-03-25 LAB — PAP IG, CT-NG NAA, HPV HIGH-RISK
Chlamydia, Nuc. Acid Amp: NEGATIVE
Gonococcus by Nucleic Acid Amp: NEGATIVE
HPV, high-risk: NEGATIVE
PAP Smear Comment: 0

## 2018-04-06 ENCOUNTER — Encounter: Payer: Self-pay | Admitting: Family Medicine

## 2018-04-15 ENCOUNTER — Encounter: Payer: Self-pay | Admitting: *Deleted

## 2018-04-20 ENCOUNTER — Ambulatory Visit: Payer: BLUE CROSS/BLUE SHIELD | Admitting: Gastroenterology

## 2018-04-20 ENCOUNTER — Telehealth: Payer: Self-pay | Admitting: Emergency Medicine

## 2018-04-20 ENCOUNTER — Encounter: Payer: Self-pay | Admitting: Gastroenterology

## 2018-04-20 VITALS — BP 104/80 | HR 76 | Ht 61.5 in | Wt 181.2 lb

## 2018-04-20 DIAGNOSIS — Z7902 Long term (current) use of antithrombotics/antiplatelets: Secondary | ICD-10-CM | POA: Diagnosis not present

## 2018-04-20 DIAGNOSIS — Z1211 Encounter for screening for malignant neoplasm of colon: Secondary | ICD-10-CM

## 2018-04-20 MED ORDER — NA SULFATE-K SULFATE-MG SULF 17.5-3.13-1.6 GM/177ML PO SOLN: 1.0000 | ORAL | 0 refills | Status: DC

## 2018-04-20 NOTE — Telephone Encounter (Signed)
[facility] Pre-operative Risk Assessment     Request for surgical clearance:     Endoscopy Procedure  What type of surgery is being performed?     colonoscopy  When is this surgery scheduled?     05-07-18  What type of clearance is required ?   Pharmacy  Are there any medications that need to be held prior to surgery and how long? Plavix 5 day  Practice name and name of physician performing surgery?      [facility]  What is your office phone and fax number?      Phone- [phone]  [phone]  Anesthesia type (None, local, MAC, general) ?       MAC

## 2018-04-20 NOTE — Progress Notes (Signed)
04/20/2018 Casey Peterson [id number] 1956-08-15   HISTORY OF PRESENT ILLNESS:  This is a 62 year old female who is here today at the request of her PCP, Dr. Creta Levin, in order to discuss colonoscopy.  She says that her last colonoscopy was about 13 years ago in [facility] and was reportedly normal at that time.  She does not have any GI complaints.  Says that she moves her bowels well.  No rectal bleeding.  She is on Plavix, which she has been on since 06/2017 by her cardiologist, Dr. Salena Saner.  She had a cardiac cath at that time but no stents placed.   Past Medical History:  Diagnosis Date  . Allergy   . CAD (coronary artery disease)    a. 06/2017: NSTEMI with cath showing 55% Ost Ramus stenosis (too small for PCI). LVEF normal.   . Cataract   . GERD (gastroesophageal reflux disease)   . Headache   . Herniated disc, cervical   . Hypercholesteremia   . Hypertension    Past Surgical History:  Procedure Laterality Date  . CARDIAC CATHETERIZATION  06/17/2017  . LEFT HEART CATH AND CORONARY ANGIOGRAPHY N/A 06/17/2017   Procedure: LEFT HEART CATH AND CORONARY ANGIOGRAPHY;  Surgeon: Marykay Lex, MD;  Location: [facility] INVASIVE CV LAB;  Service: Cardiovascular;  Laterality: N/A;    reports that she has never smoked. She quit smokeless tobacco use about 7 years ago. Her smokeless tobacco use included snuff. She reports that she has current or past drug history. Drug: "Crack" cocaine. She reports that she does not drink alcohol. family history includes Breast cancer in her sister; Crohn's disease in her sister; Diabetes in her mother, sister, sister, sister, and sister; Heart disease in her brother and mother; Hyperlipidemia in her mother and sister; Hypertension in her father and mother; Kidney cancer in her mother; Stroke in her father, mother, and sister; Thyroid disease in her sister. Allergies  Allergen Reactions  . Flagyl [Metronidazole] Rash      Outpatient Encounter Medications as of 04/20/2018    Medication Sig  . aspirin EC 81 MG EC tablet Take 1 tablet (81 mg total) by mouth daily.  Marland Kitchen atorvastatin (LIPITOR) 40 MG tablet Take 1 tablet (40 mg total) by mouth daily at 6 PM.  . clopidogrel (PLAVIX) 75 MG tablet Take 1 tablet (75 mg total) by mouth daily.  . fluticasone (FLONASE) 50 MCG/ACT nasal spray Place 2 sprays into both nostrils daily. (Patient taking differently: Place 2 sprays into both nostrils daily as needed for allergies. )  . hydrochlorothiazide (HYDRODIURIL) 25 MG tablet Take 0.5 tablets (12.5 mg total) by mouth daily.  Marland Kitchen loratadine (CLARITIN) 10 MG tablet Take 1 tablet (10 mg total) by mouth daily. (Patient taking differently: Take 10 mg by mouth daily as needed for allergies. )  . losartan (COZAAR) 50 MG tablet Take 1 tablet (50 mg total) by mouth daily.  . Multiple Vitamin (MULTIVITAMIN WITH MINERALS) TABS tablet Take 1 tablet by mouth daily.  . pantoprazole (PROTONIX) 20 MG tablet 20 mg daily.   . vitamin C (ASCORBIC ACID) 500 MG tablet Take 500 mg by mouth daily.  . [DISCONTINUED] diclofenac sodium (VOLTAREN) 1 % GEL Apply 2 g topically 4 (four) times daily.  . [DISCONTINUED] meclizine (ANTIVERT) 25 MG tablet Take 25 mg by mouth 3 (three) times daily as needed for dizziness.  . [DISCONTINUED] ranitidine (ZANTAC) 150 MG tablet Take 0.5 tablets (75 mg total) by mouth 2 (two) times daily.  No facility-administered encounter medications on file as of 04/20/2018.      REVIEW OF SYSTEMS  : All other systems reviewed and negative except where noted in the History of Present Illness.   PHYSICAL EXAM: BP 104/80   Pulse 76   Ht 5' 1.5" (1.562 m)   Wt 181 lb 4 oz (82.2 kg)   BMI 33.69 kg/m  General: Well developed black female in no acute distress Head: Normocephalic and atraumatic Eyes:  Sclerae anicteric, conjunctiva pink. Ears: Normal auditory acuity Lungs: Clear throughout to auscultation; no increased WOB. Heart: Regular rate and rhythm; no M/R/G. Abdomen: Soft,  non-distended.  BS present.  Non-tender. Rectal:  Will be done at the time of colonoscopy. Musculoskeletal: Symmetrical with no gross deformities  Skin: No lesions on visible extremities Extremities: No edema  Neurological: Alert oriented x 4, grossly non-focal Psychological:  Alert and cooperative. Normal mood and affect  ASSESSMENT AND PLAN: *Screening for CRC:  Last colonoscopy was about 13 years ago, reportedly normal. *Chronic antiplatelet use with Plavix due to CAD:  Hold Plavix for 5 days before procedure - will instruct when and how to resume after procedure. Risks and benefits of procedure including bleeding, perforation, infection, missed lesions, medication reactions and possible hospitalization or surgery if complications occur explained. Additional rare but real risk of cardiovascular event such as heart attack or ischemia/infarct of other organs off of Plavix explained and need to seek urgent help if this occurs. Will communicate by phone or EMR with patient's prescribing provider, Dr. Salena Saner, to confirm that holding Plavix is reasonable in this case.  CC:  Doristine BosworthStallings, Zoe A, MD

## 2018-04-20 NOTE — Patient Instructions (Signed)

## 2018-04-22 NOTE — Progress Notes (Signed)
Reviewed and agree with documentation and assessment and plan. K. Veena Savahna Casados , MD   

## 2018-04-24 NOTE — Telephone Encounter (Signed)
Cr C, this pt has hx of NSTEMI 06/2017, Cath showed 55% ostial CFX disease and normal LVF. The plan has been for medical Rx. Can Plavix be held for 5 days for colonoscopy?  Please route response back to CV DIV PREOP  Thanks, Coralee NorthNina

## 2018-04-24 NOTE — Telephone Encounter (Signed)
Okay to hold Plavix 7 days prior to colonoscopy and resume after. Casey MillersBrian Jahzara Slattery, MD

## 2018-04-27 NOTE — Telephone Encounter (Signed)
Spoke to patient and informed her to hold Plavix one week before procedure. She verbalized understanding and knows that we will let her know at discharge when to resume her medication.

## 2018-04-27 NOTE — Telephone Encounter (Signed)
Routing to [facility] Dr. Ludwig Clarksrenshaw's recommendation.  Rosalio MacadamiaLori C. Helga Asbury, RN, ANP-C [facility] [address] [place], [place]  [id number] [phone]

## 2018-05-07 ENCOUNTER — Ambulatory Visit (AMBULATORY_SURGERY_CENTER): Payer: BLUE CROSS/BLUE SHIELD | Admitting: Gastroenterology

## 2018-05-07 ENCOUNTER — Encounter: Payer: Self-pay | Admitting: Gastroenterology

## 2018-05-07 VITALS — BP 145/87 | HR 69 | Temp 99.1°F | Resp 26 | Ht 61.5 in | Wt 181.0 lb

## 2018-05-07 DIAGNOSIS — Z1211 Encounter for screening for malignant neoplasm of colon: Secondary | ICD-10-CM | POA: Diagnosis not present

## 2018-05-07 DIAGNOSIS — K6289 Other specified diseases of anus and rectum: Secondary | ICD-10-CM

## 2018-05-07 MED ORDER — SODIUM CHLORIDE 0.9 % IV SOLN: 500.0000 mL | Freq: Once | INTRAVENOUS | Status: DC

## 2018-05-07 NOTE — Progress Notes (Signed)
A/ox3, pleased with MAC, report to RN 

## 2018-05-07 NOTE — Op Note (Signed)
[facility] Patient Name: Casey EdmanMary Peterson Procedure Date: 05/07/2018 10:03 AM MRN: [id number] Endoscopist: Napoleon FormKavitha V. Nandigam , MD Age: 62 Referring MD:  Date of Birth: 08/03/1956 Gender: Female Account #: [id number] Procedure:                Colonoscopy Indications:              Screening for colorectal malignant neoplasm Medicines:                Monitored Anesthesia Care Procedure:                Pre-Anesthesia Assessment:                           - Prior to the procedure, a History and Physical                            was performed, and patient medications and                            allergies were reviewed. The patient's tolerance of                            previous anesthesia was also reviewed. The risks                            and benefits of the procedure and the sedation                            options and risks were discussed with the patient.                            All questions were answered, and informed consent                            was obtained. Prior Anticoagulants: The patient                            last took Plavix (clopidogrel) 5 days prior to the                            procedure. ASA Grade Assessment: III - A patient                            with severe systemic disease. After reviewing the                            risks and benefits, the patient was deemed in                            satisfactory condition to undergo the procedure.                           After obtaining informed consent, the colonoscope  was passed under direct vision. Throughout the                            procedure, the patient's blood pressure, pulse, and                            oxygen saturations were monitored continuously. The                            Model PCF-[id number]) scope was introduced                            through the anus and advanced to the the cecum,                            identified  by appendiceal orifice and ileocecal                            valve. The colonoscopy was performed without                            difficulty. The patient tolerated the procedure                            well. The quality of the bowel preparation was                            excellent. The ileocecal valve, appendiceal                            orifice, and rectum were photographed. Scope In: 10:06:55 AM Scope Out: 10:21:01 AM Scope Withdrawal Time: 0 hours 11 minutes 21 seconds  Total Procedure Duration: 0 hours 14 minutes 6 seconds  Findings:                 The perianal and digital rectal examinations were                            normal.                           Scattered small and large-mouthed diverticula were                            found in the sigmoid colon, descending colon,                            transverse colon, ascending colon and cecum.                           Non-bleeding internal hemorrhoids were found during                            retroflexion. The hemorrhoids were small.  A localized area of moderately erythematous friable                            mucosa was found in the distal rectum. Biopsies                            were taken with a cold forceps for histology. Complications:            No immediate complications. Estimated Blood Loss:     Estimated blood loss was minimal. Impression:               - Moderate diverticulosis in the sigmoid colon, in                            the descending colon, in the transverse colon, in                            the ascending colon and in the cecum.                           - Non-bleeding internal hemorrhoids.                           - Erythematous mucosa in the distal rectum.                            Biopsied. Recommendation:           - Patient has a contact number available for                            emergencies. The signs and symptoms of potential                             delayed complications were discussed with the                            patient. Return to normal activities tomorrow.                            Written discharge instructions were provided to the                            patient.                           - Resume previous diet.                           - Continue present medications.                           - Resume Plavix (clopidogrel) at prior dose                            tomorrow. Refer to managing physician for further  adjustment of therapy.                           - Await pathology results.                           - Repeat colonoscopy in 10 years for surveillance                            based on pathology results. Napoleon Form, MD 05/07/2018 10:29:46 AM This report has been signed electronically.

## 2018-05-07 NOTE — Progress Notes (Signed)
History reviewed today 

## 2018-05-07 NOTE — Patient Instructions (Signed)
YOU HAD AN ENDOSCOPIC PROCEDURE TODAY AT THE [facility]:   Refer to the procedure report that was given to you for any specific questions about what was found during the examination.  If the procedure report does not answer your questions, please call your gastroenterologist to clarify.  If you requested that your care partner not be given the details of your procedure findings, then the procedure report has been included in a sealed envelope for you to review at your convenience later.  YOU SHOULD EXPECT: Some feelings of bloating in the abdomen. Passage of more gas than usual.  Walking can help get rid of the air that was put into your GI tract during the procedure and reduce the bloating. If you had a lower endoscopy (such as a colonoscopy or flexible sigmoidoscopy) you may notice spotting of blood in your stool or on the toilet paper. If you underwent a bowel prep for your procedure, you may not have a normal bowel movement for a few days.  Please Note:  You might notice some irritation and congestion in your nose or some drainage.  This is from the oxygen used during your procedure.  There is no need for concern and it should clear up in a day or so.  SYMPTOMS TO REPORT IMMEDIATELY:   Following lower endoscopy (colonoscopy or flexible sigmoidoscopy):  Excessive amounts of blood in the stool  Significant tenderness or worsening of abdominal pains  Swelling of the abdomen that is new, acute  Fever of 100F or higher  PLease see handouts given to you on Diverticulosis and Hemorrhoids.  For urgent or emergent issues, a gastroenterologist can be reached at any hour by calling [phone].   DIET:  We do recommend a small meal at first, but then you may proceed to your regular diet.  Drink plenty of fluids but you should avoid alcoholic beverages for 24 hours.  ACTIVITY:  You should plan to take it easy for the rest of today and you should NOT DRIVE or use heavy machinery until  tomorrow (because of the sedation medicines used during the test).    FOLLOW UP: Our staff will call the number listed on your records the next business day following your procedure to check on you and address any questions or concerns that you may have regarding the information given to you following your procedure. If we do not reach you, we will leave a message.  However, if you are feeling well and you are not experiencing any problems, there is no need to return our call.  We will assume that you have returned to your regular daily activities without incident.  If any biopsies were taken you will be contacted by phone or by letter within the next 1-3 weeks.  Please call us at [phone] if you have not heard about the biopsies in 3 weeks.    SIGNATURES/CONFIDENTIALITY: You and/or your care partner have signed paperwork which will be entered into your electronic medical record.  These signatures attest to the fact that that the information above on your After Visit Summary has been reviewed and is understood.  Full responsibility of the confidentiality of this discharge information lies with you and/or your care-partner.  Thank you for letting us take care of your healthcare needs today.

## 2018-05-07 NOTE — Progress Notes (Signed)
Called to room to assist during endoscopic procedure.  Patient ID and intended procedure confirmed with present staff. Received instructions for my participation in the procedure from the performing physician.  

## 2018-05-08 ENCOUNTER — Telehealth: Payer: Self-pay

## 2018-05-08 NOTE — Telephone Encounter (Signed)
  Follow up Call-  Call Casey Peterson number 05/07/2018  Post procedure Call Casey Peterson phone  # [phone]  Permission to leave phone message Yes  Some recent data might be hidden     Patient questions:  Do you have a fever, pain , or abdominal swelling? No. Pain Score  0 *  Have you tolerated food without any problems? Yes.    Have you been able to return to your normal activities? Yes.    Do you have any questions about your discharge instructions: Diet   No. Medications  No. Follow up visit  No.  Do you have questions or concerns about your Care? No.  Actions: * If pain score is 4 or above: No action needed, pain <4.

## 2018-06-18 ENCOUNTER — Ambulatory Visit
Admission: RE | Admit: 2018-06-18 | Discharge: 2018-06-18 | Disposition: A | Discharge status: Home / Self Care | Payer: BLUE CROSS/BLUE SHIELD | Source: Ambulatory Visit | Attending: Family Medicine | Admitting: Family Medicine

## 2018-06-18 DIAGNOSIS — Z1239 Encounter for other screening for malignant neoplasm of breast: Secondary | ICD-10-CM

## 2018-06-18 DIAGNOSIS — Z1231 Encounter for screening mammogram for malignant neoplasm of breast: Secondary | ICD-10-CM | POA: Diagnosis not present

## 2018-06-22 ENCOUNTER — Other Ambulatory Visit: Payer: Self-pay | Admitting: Family Medicine

## 2018-06-22 DIAGNOSIS — R928 Other abnormal and inconclusive findings on diagnostic imaging of breast: Secondary | ICD-10-CM

## 2018-06-24 ENCOUNTER — Ambulatory Visit
Admission: RE | Admit: 2018-06-24 | Discharge: 2018-06-24 | Disposition: A | Discharge status: Home / Self Care | Payer: BLUE CROSS/BLUE SHIELD | Source: Ambulatory Visit | Attending: Family Medicine | Admitting: Family Medicine

## 2018-06-24 DIAGNOSIS — R928 Other abnormal and inconclusive findings on diagnostic imaging of breast: Secondary | ICD-10-CM | POA: Diagnosis not present

## 2018-06-24 DIAGNOSIS — N6489 Other specified disorders of breast: Secondary | ICD-10-CM | POA: Diagnosis not present

## 2018-07-07 ENCOUNTER — Other Ambulatory Visit: Payer: Self-pay | Admitting: Student

## 2018-07-10 ENCOUNTER — Other Ambulatory Visit: Payer: Self-pay | Admitting: Student

## 2018-07-10 DIAGNOSIS — I10 Essential (primary) hypertension: Secondary | ICD-10-CM

## 2018-09-28 ENCOUNTER — Ambulatory Visit (INDEPENDENT_AMBULATORY_CARE_PROVIDER_SITE_OTHER): Payer: BLUE CROSS/BLUE SHIELD

## 2018-09-28 ENCOUNTER — Other Ambulatory Visit: Payer: Self-pay

## 2018-09-28 ENCOUNTER — Ambulatory Visit (INDEPENDENT_AMBULATORY_CARE_PROVIDER_SITE_OTHER): Payer: BLUE CROSS/BLUE SHIELD | Admitting: Family Medicine

## 2018-09-28 ENCOUNTER — Encounter: Payer: Self-pay | Admitting: Family Medicine

## 2018-09-28 VITALS — BP 132/81 | HR 79 | Temp 98.6°F | Resp 17 | Ht 61.5 in | Wt 183.8 lb

## 2018-09-28 DIAGNOSIS — M5126 Other intervertebral disc displacement, lumbar region: Secondary | ICD-10-CM | POA: Diagnosis not present

## 2018-09-28 DIAGNOSIS — M545 Low back pain: Secondary | ICD-10-CM | POA: Diagnosis not present

## 2018-09-28 DIAGNOSIS — Z23 Encounter for immunization: Secondary | ICD-10-CM | POA: Diagnosis not present

## 2018-09-28 DIAGNOSIS — M25552 Pain in left hip: Secondary | ICD-10-CM

## 2018-09-28 DIAGNOSIS — R252 Cramp and spasm: Secondary | ICD-10-CM

## 2018-09-28 DIAGNOSIS — I214 Non-ST elevation (NSTEMI) myocardial infarction: Secondary | ICD-10-CM

## 2018-09-28 DIAGNOSIS — J301 Allergic rhinitis due to pollen: Secondary | ICD-10-CM

## 2018-09-28 DIAGNOSIS — Z5181 Encounter for therapeutic drug level monitoring: Secondary | ICD-10-CM | POA: Diagnosis not present

## 2018-09-28 DIAGNOSIS — I25810 Atherosclerosis of coronary artery bypass graft(s) without angina pectoris: Secondary | ICD-10-CM | POA: Diagnosis not present

## 2018-09-28 DIAGNOSIS — M5136 Other intervertebral disc degeneration, lumbar region: Secondary | ICD-10-CM

## 2018-09-28 DIAGNOSIS — Z789 Other specified health status: Secondary | ICD-10-CM

## 2018-09-28 DIAGNOSIS — M4716 Other spondylosis with myelopathy, lumbar region: Secondary | ICD-10-CM

## 2018-09-28 DIAGNOSIS — M51369 Other intervertebral disc degeneration, lumbar region without mention of lumbar back pain or lower extremity pain: Secondary | ICD-10-CM

## 2018-09-28 MED ORDER — LORATADINE 10 MG PO TABS: 10.0000 mg | ORAL_TABLET | Freq: Every day | ORAL | 3 refills | Status: DC

## 2018-09-28 MED ORDER — ROSUVASTATIN CALCIUM 20 MG PO TABS: 20.0000 mg | ORAL_TABLET | ORAL | 1 refills | Status: DC

## 2018-09-28 MED ORDER — PANTOPRAZOLE SODIUM 20 MG PO TBEC: 20.0000 mg | DELAYED_RELEASE_TABLET | Freq: Every day | ORAL | 3 refills | Status: AC

## 2018-09-28 NOTE — Patient Instructions (Addendum)
Follow up with physical therapy for back pain Change cholesterol medication from lipitor to crestor (rosuvastatin) every other day Will contact you with lab results Avoid starchy foods to prevent diabetes  (rice, pasta, bread and sweets) Diabetes.org for tips on how to do meal planning    If you have lab work done today you will be contacted with your lab results within the next 2 weeks.  If you have not heard from [place] then please contact us. The fastest way to get your results is to register for My Chart.   IF you received an x-ray today, you will receive an invoice from [facility]. Please contact [facility] at [phone] with questions or concerns regarding your invoice.   IF you received labwork today, you will receive an invoice from [facility]. Please contact [facility] at [phone] with questions or concerns regarding your invoice.   Our billing staff will not be able to assist you with questions regarding bills from these companies.  You will be contacted with the lab results as soon as they are available. The fastest way to get your results is to activate your My Chart account. Instructions are located on the last page of this paperwork. If you have not heard from [place] regarding the results in 2 weeks, please contact this office.     Sciatica  Sciatica is pain, numbness, weakness, or tingling along the path of the sciatic nerve. The sciatic nerve starts in the lower back and runs down the back of each leg. The nerve controls the muscles in the lower leg and in the back of the knee. It also provides feeling (sensation) to the back of the thigh, the lower leg, and the sole of the foot. Sciatica is a symptom of another medical condition that pinches or puts pressure on the sciatic nerve. Generally, sciatica only affects one side of the body. Sciatica usually goes away on its own or with treatment. In some cases, sciatica may keep coming back (recur). What are the  causes? This condition is caused by pressure on the sciatic nerve, or pinching of the sciatic nerve. This may be the result of:  A disk in between the bones of the spine (vertebrae) bulging out too far (herniated disk).  Age-related changes in the spinal disks (degenerative disk disease).  A pain disorder that affects a muscle in the buttock (piriformis syndrome).  Extra bone growth (bone spur) near the sciatic nerve.  An injury or break (fracture) of the pelvis.  Pregnancy.  Tumor (rare). What increases the risk? The following factors may make you more likely to develop this condition:  Playing sports that place pressure or stress on the spine, such as football or weight lifting.  Having poor strength and flexibility.  A history of back injury.  A history of back surgery.  Sitting for long periods of time.  Doing activities that involve repetitive bending or lifting.  Obesity. What are the signs or symptoms? Symptoms can vary from mild to very severe, and they may include:  Any of these problems in the lower back, leg, hip, or buttock: ? Mild tingling or dull aches. ? Burning sensations. ? Sharp pains.  Numbness in the back of the calf or the sole of the foot.  Leg weakness.  Severe back pain that makes movement difficult. These symptoms may get worse when you cough, sneeze, or laugh, or when you sit or stand for long periods of time. Being overweight may also make symptoms worse. In some  cases, symptoms may recur over time. How is this diagnosed? This condition may be diagnosed based on:  Your symptoms.  A physical exam. Your health care provider may ask you to do certain movements to check whether those movements trigger your symptoms.  You may have tests, including: ? Blood tests. ? X-rays. ? MRI. ? CT scan. How is this treated? In many cases, this condition improves on its own, without any treatment. However, treatment may include:  Reducing or  modifying physical activity during periods of pain.  Exercising and stretching to strengthen your abdomen and improve the flexibility of your spine.  Icing and applying heat to the affected area.  Medicines that help: ? To relieve pain and swelling. ? To relax your muscles.  Injections of medicines that help to relieve pain, irritation, and inflammation around the sciatic nerve (steroids).  Surgery. Follow these instructions at home: Medicines  Take over-the-counter and prescription medicines only as told by your health care provider.  Do not drive or operate heavy machinery while taking prescription pain medicine. Managing pain  If directed, apply ice to the affected area. ? Put ice in a plastic bag. ? Place a towel between your skin and the bag. ? Leave the ice on for 20 minutes, 2-3 times a day.  After icing, apply heat to the affected area before you exercise or as often as told by your health care provider. Use the heat source that your health care provider recommends, such as a moist heat pack or a heating pad. ? Place a towel between your skin and the heat source. ? Leave the heat on for 20-30 minutes. ? Remove the heat if your skin turns bright red. This is especially important if you are unable to feel pain, heat, or cold. You may have a greater risk of getting burned. Activity  Return to your normal activities as told by your health care provider. Ask your health care provider what activities are safe for you. ? Avoid activities that make your symptoms worse.  Take brief periods of rest throughout the day. Resting in a lying or standing position is usually better than sitting to rest. ? When you rest for longer periods, mix in some mild activity or stretching between periods of rest. This will help to prevent stiffness and pain. ? Avoid sitting for long periods of time without moving. Get up and move around at least one time each hour.  Exercise and stretch regularly,  as told by your health care provider.  Do not lift anything that is heavier than 10 lb (4.5 kg) while you have symptoms of sciatica. When you do not have symptoms, you should still avoid heavy lifting, especially repetitive heavy lifting.  When you lift objects, always use proper lifting technique, which includes: ? Bending your knees. ? Keeping the load close to your body. ? Avoiding twisting. General instructions  Use good posture. ? Avoid leaning forward while sitting. ? Avoid hunching over while standing.  Maintain a healthy weight. Excess weight puts extra stress on your back and makes it difficult to maintain good posture.  Wear supportive, comfortable shoes. Avoid wearing high heels.  Avoid sleeping on a mattress that is too soft or too hard. A mattress that is firm enough to support your back when you sleep may help to reduce your pain.  Keep all follow-up visits as told by your health care provider. This is important. Contact a health care provider if:  You have pain that wakes  you up when you are sleeping.  You have pain that gets worse when you lie down.  Your pain is worse than you have experienced in the past.  Your pain lasts longer than 4 weeks.  You experience unexplained weight loss. Get help right away if:  You lose control of your bowel or bladder (incontinence).  You have: ? Weakness in your lower back, pelvis, buttocks, or legs that gets worse. ? Redness or swelling of your back. ? A burning sensation when you urinate. This information is not intended to replace advice given to you by your health care provider. Make sure you discuss any questions you have with your health care provider. Document Released: 08/27/2001 Document Revised: 02/06/2016 Document Reviewed: 05/12/2015 [employer]vier Interactive Patient Education  2019 [employer]

## 2018-09-28 NOTE — Progress Notes (Signed)
Established Patient Office Visit  Subjective:  Patient ID: Casey Peterson, female    DOB: 01-21-1956  Age: 63 y.o. MRN: [id number]  CC:  Chief Complaint  Patient presents with  . Follow-up    6 month f/u   . pulled muscles in thighs x 1 month that has gotten worse ove  . Medication Refill    flonase,claritin and protonix    HPI Casey Peterson presents for   Muscle Pain Pain that radiates down the left hip into the thigh She has a history of bulging discs  She denies numbness and numbness of the groin She denies limping but reports that she has not been able to exercise No known trauma Patient states that she cannot take statins due to pain in the muscles and cramps She had a NSTEMI 06/2017  Coronary Artery Disease: Patient presents for routine coronary artery disease follow-up.  Current symptoms: non-existent Pain radiation: none Patient also complains of no other symptoms. Pain aggravating factors: none.   Cardiac risk factors include dyslipidemia. Patient states that she cannot take statins due to pain in the muscles and cramps She had a NSTEMI 06/2017  Dyslipidemia: Patient presents for evaluation of lipids.  A repeat fasting lipid profile was done.  The patient does not use medications that may worsen dyslipidemias (corticosteroids, progestins, anabolic steroids, diuretics, beta-blockers, amiodarone, cyclosporine, olanzapine). The patient exercises intermittently.  The patient is known to have coexisting coronary artery disease.   Patient states that she cannot take statins due to pain in the muscles and cramps She had a NSTEMI 06/2017      Past Medical History:  Diagnosis Date  . Allergy   . CAD (coronary artery disease)    a. 06/2017: NSTEMI with cath showing 55% Ost Ramus stenosis (too small for PCI). LVEF normal.   . Cataract   . GERD (gastroesophageal reflux disease)   . Headache   . Herniated intervertebral disc of lumbar spine   . Hypercholesteremia   .  Hypertension     Past Surgical History:  Procedure Laterality Date  . CARDIAC CATHETERIZATION  06/17/2017  . LEFT HEART CATH AND CORONARY ANGIOGRAPHY N/A 06/17/2017   Procedure: LEFT HEART CATH AND CORONARY ANGIOGRAPHY;  Surgeon: Marykay Lex, MD;  Location: [facility] INVASIVE CV LAB;  Service: Cardiovascular;  Laterality: N/A;    Family History  Problem Relation Age of Onset  . Diabetes Mother   . Heart disease Mother   . Hyperlipidemia Mother   . Hypertension Mother   . Stroke Mother   . Kidney cancer Mother   . Stroke Father   . Hypertension Father   . Hyperlipidemia Sister   . Stroke Sister   . Diabetes Sister   . Crohn's disease Sister   . Diabetes Sister   . Heart disease Brother   . Diabetes Sister   . Breast cancer Sister   . Diabetes Sister   . Thyroid disease Sister   . Colon cancer Neg Hx   . Stomach cancer Neg Hx   . Esophageal cancer Neg Hx   . Rectal cancer Neg Hx   . Liver cancer Neg Hx   . Colon polyps Neg Hx     Social History   Socioeconomic History  . Marital status: Married    Spouse name: Noelly Thrower  . Number of children: 2  . Years of education: Not on file  . Highest education level: Not on file  Occupational History  . Occupation: nutrition  Employer: [employer]  Social Needs  . Financial resource strain: Not on file  . Food insecurity:    Worry: Not on file    Inability: Not on file  . Transportation needs:    Medical: Not on file    Non-medical: Not on file  Tobacco Use  . Smoking status: Never Smoker  . Smokeless tobacco: Former Neurosurgeon    Types: Snuff  Substance and Sexual Activity  . Alcohol use: No  . Drug use: Yes    Types: "Crack" cocaine    Comment: "in the 80s"  . Sexual activity: Yes    Partners: Male  Lifestyle  . Physical activity:    Days per week: Not on file    Minutes per session: Not on file  . Stress: Not on file  Relationships  . Social connections:    Talks on phone: Not on file    Gets  together: Not on file    Attends religious service: Not on file    Active member of club or organization: Not on file    Attends meetings of clubs or organizations: Not on file    Relationship status: Not on file  . Intimate partner violence:    Fear of current or ex partner: Not on file    Emotionally abused: Not on file    Physically abused: Not on file    Forced sexual activity: Not on file  Other Topics Concern  . Not on file  Social History Narrative  . Not on file    Outpatient Medications Prior to Visit  Medication Sig Dispense Refill  . aspirin EC 81 MG EC tablet Take 1 tablet (81 mg total) by mouth daily.    . clopidogrel (PLAVIX) 75 MG tablet TAKE 1 TABLET BY MOUTH ONCE DAILY 30 tablet 11  . fluticasone (FLONASE) 50 MCG/ACT nasal spray Place 2 sprays into both nostrils daily. (Patient taking differently: Place 2 sprays into both nostrils daily as needed for allergies. ) 16 g 9  . hydrochlorothiazide (HYDRODIURIL) 25 MG tablet TAKE 1/2 (ONE-HALF) TABLET BY MOUTH ONCE DAILY 15 tablet 23  . losartan (COZAAR) 50 MG tablet TAKE 1 TABLET BY MOUTH ONCE DAILY 30 tablet 11  . Multiple Vitamin (MULTIVITAMIN WITH MINERALS) TABS tablet Take 1 tablet by mouth daily.    . vitamin C (ASCORBIC ACID) 500 MG tablet Take 500 mg by mouth daily.    Marland Kitchen atorvastatin (LIPITOR) 40 MG tablet TAKE 1 TABLET BY MOUTH ONCE DAILY AT  6PM 30 tablet 11  . loratadine (CLARITIN) 10 MG tablet Take 1 tablet (10 mg total) by mouth daily. (Patient taking differently: Take 10 mg by mouth daily as needed for allergies. ) 90 tablet 3  . pantoprazole (PROTONIX) 20 MG tablet 20 mg daily.   7   No facility-administered medications prior to visit.     Allergies  Allergen Reactions  . Flagyl [Metronidazole] Rash    ROS Review of Systems  Review of Systems  Constitutional: Negative for activity change, appetite change, chills and fever.  HENT: Negative for congestion, nosebleeds, trouble swallowing and voice  change.   Respiratory: Negative for cough, shortness of breath and wheezing.   Gastrointestinal: Negative for diarrhea, nausea and vomiting.  Genitourinary: Negative for difficulty urinating, dysuria, flank pain and hematuria.  Musculoskeletal: see hpi Neurological: Negative for dizziness, speech difficulty, light-headedness and numbness.  See HPI. All other review of systems negative.     Objective:    Physical Exam  BP 132/81 (BP Location: Right Arm, Patient Position: Sitting, Cuff Size: Large)   Pulse 79   Temp 98.6 F (37 C) (Oral)   Resp 17   Ht 5' 1.5" (1.562 m)   Wt 183 lb 12.8 oz (83.4 kg)   SpO2 96%   BMI 34.17 kg/m  Wt Readings from Last 3 Encounters:  09/28/18 183 lb 12.8 oz (83.4 kg)  05/07/18 181 lb (82.1 kg)  04/20/18 181 lb 4 oz (82.2 kg)    Physical Exam  Constitutional: Oriented to person, place, and time. Appears well-developed and well-nourished.  HENT:  Head: Normocephalic and atraumatic.  Eyes: Conjunctivae and EOM are normal.  Cardiovascular: Normal rate, regular rhythm, normal heart sounds and intact distal pulses.  No murmur heard. Pulmonary/Chest: Effort normal and breath sounds normal. No stridor. No respiratory distress. Has no wheezes.  Neurological: Is alert and oriented to person, place, and time.  Skin: Skin is warm. Capillary refill takes less than 2 seconds.  Psychiatric: Has a normal mood and affect. Behavior is normal. Judgment and thought content normal.   Lumbar Radiculopathy Exam Back exam: full range of motion, no tenderness, palpable spasm or pain on motion. Straight-leg raise:  Negative bilaterally Reflexes:       Right leg: 2+ at knees bilaterally      Left leg: 2+ at knees bilaterally Strength: normal and equal bilaterally  Sensory exam: normal in both lower extremities.  Able to toe walk, heel walk without difficulty or obvious weakness. No obvious pain with hip motion or log rolling of leg.  CLINICAL DATA:  Left hip  pain.  EXAM: DG HIP (WITH OR WITHOUT PELVIS) 2-3V LEFT  COMPARISON:  None.  FINDINGS: There is no evidence of hip fracture or dislocation. There is no evidence of arthropathy or other focal bone abnormality.  IMPRESSION: Negative.   Electronically Signed   By: Obie DredgeWilliam T Derry M.D.   On: 09/28/2018 10:17  CLINICAL DATA:  Back pain radiating to the left hip.  EXAM: LUMBAR SPINE - COMPLETE 4+ VIEW  COMPARISON:  None.  FINDINGS: Five lumbar type vertebral bodies. No acute fracture or subluxation. Vertebral body heights are preserved. 4 mm anterolisthesis at L4-L5. Mild disc height loss at T12-L1, L1-L2, L4-L5, and L5-S1. Lower lumbar facet arthropathy, moderate at L4-L5. The sacroiliac joints are unremarkable. Probable calcified fibroids in the pelvis.  IMPRESSION: 1.  No acute osseous abnormality. 2. Multilevel lumbar spondylosis as above.   Electronically Signed   By: Obie DredgeWilliam T Derry M.D.   On: 09/28/2018 10:16  There are no preventive care reminders to display for this patient.  There are no preventive care reminders to display for this patient.  Lab Results  Component Value Date   TSH 0.893 09/28/2018   Lab Results  Component Value Date   WBC 6.4 09/28/2018   HGB 13.1 09/28/2018   HCT 38.6 09/28/2018   MCV 84 09/28/2018   PLT 311 09/28/2018   Lab Results  Component Value Date   NA 139 09/28/2018   K 4.1 09/28/2018   CO2 24 09/28/2018   GLUCOSE 100 (H) 09/28/2018   BUN 12 09/28/2018   CREATININE 0.94 09/28/2018   BILITOT 0.2 09/28/2018   ALKPHOS 63 09/28/2018   AST 22 09/28/2018   ALT 23 09/28/2018   PROT 7.3 09/28/2018   ALBUMIN 4.5 09/28/2018   CALCIUM 9.6 09/28/2018   ANIONGAP 8 06/17/2017   Lab Results  Component Value Date   CHOL 216 (H) 09/28/2018   Lab Results  Component Value Date   HDL 111 09/28/2018   Lab Results  Component Value Date   LDLCALC 93 09/28/2018   Lab Results  Component Value Date   TRIG 58  09/28/2018   Lab Results  Component Value Date   CHOLHDL 1.9 09/28/2018   Lab Results  Component Value Date   HGBA1C 6.1 (H) 09/28/2018      Assessment & Plan:   Problem List Items Addressed This Visit      Cardiovascular and Mediastinum   CAD (coronary artery disease)  -  Will check CK and if pt has muscle breakdown and cannot be on a statin will refer to lipid clinic based on patient's risk factors    Relevant Medications   rosuvastatin (CRESTOR) 20 MG tablet   Other Relevant Orders   Comprehensive metabolic panel (Completed)   TSH (Completed)   Lipid panel (Completed)   CBC (Completed)    Other Visit Diagnoses    Lumbar spondylosis with myelopathy    -  Primary Advised pt to see PT for back pain    Relevant Orders   Ambulatory referral to Physical Therapy   Encounter for medication monitoring       Relevant Orders   Comprehensive metabolic panel (Completed)   Hemoglobin A1c (Completed)   Lipid panel (Completed)   CBC (Completed)   CK isoenzymes (brain, muscle injury)   Hepatic Function Panel   Lipid panel   Muscle cramps    - likely musculoskeletal  But will plan to stop the statin for a month and recheck    Relevant Orders   Comprehensive metabolic panel (Completed)   TSH (Completed)   CK isoenzymes (brain, muscle injury) (Completed)   CBC (Completed)   CK isoenzymes (brain, muscle injury)   Hepatic Function Panel   Lipid panel   NSTEMI (non-ST elevated myocardial infarction) (HCC)       Relevant Medications   rosuvastatin (CRESTOR) 20 MG tablet   Other Relevant Orders   Lipid panel (Completed)   Bulging lumbar disc       Relevant Orders   DG Lumbar Spine Complete (Completed)   Left hip pain       Relevant Orders   CBC (Completed)   DG HIP UNILAT W OR W/O PELVIS 2-3 VIEWS LEFT (Completed)   Seasonal allergic rhinitis due to pollen       Relevant Medications   loratadine (CLARITIN) 10 MG tablet   Flu vaccine need       Statin intolerance    -   Discussed Crestor every other day Will check for muscle breakdown   Relevant Orders   CK isoenzymes (brain, muscle injury)   Hepatic Function Panel   Lipid panel      Meds ordered this encounter  Medications  . loratadine (CLARITIN) 10 MG tablet    Sig: Take 1 tablet (10 mg total) by mouth daily.    Dispense:  90 tablet    Refill:  3  . pantoprazole (PROTONIX) 20 MG tablet    Sig: Take 1 tablet (20 mg total) by mouth daily.    Dispense:  90 tablet    Refill:  3  . rosuvastatin (CRESTOR) 20 MG tablet    Sig: Take 1 tablet (20 mg total) by mouth every other day.    Dispense:  45 tablet    Refill:  1   A total of 40 minutes were spent face-to-face with the patient during this encounter and over half of that time  was spent on counseling and coordination of care.  Follow-up: Return in about 3 months (around 12/28/2018) for back pain follow up .    Doristine BosworthZoe A Annie Roseboom, MD

## 2018-10-03 LAB — COMPREHENSIVE METABOLIC PANEL
A/G RATIO: 1.6 (ref 1.2–2.2)
ALBUMIN: 4.5 g/dL (ref 3.6–4.8)
ALT: 23 IU/L (ref 0–32)
AST: 22 IU/L (ref 0–40)
Alkaline Phosphatase: 63 IU/L (ref 39–117)
BUN / CREAT RATIO: 13 (ref 12–28)
BUN: 12 mg/dL (ref 8–27)
Bilirubin Total: 0.2 mg/dL (ref 0.0–1.2)
CALCIUM: 9.6 mg/dL (ref 8.7–10.3)
CO2: 24 mmol/L (ref 20–29)
CREATININE: 0.94 mg/dL (ref 0.57–1.00)
Chloride: 101 mmol/L (ref 96–106)
GFR, EST AFRICAN AMERICAN: 75 mL/min/{1.73_m2} (ref >59)
GFR, EST NON AFRICAN AMERICAN: 65 mL/min/{1.73_m2} (ref >59)
GLOBULIN, TOTAL: 2.8 g/dL (ref 1.5–4.5)
Glucose: 100 mg/dL — ABNORMAL HIGH (ref 65–99)
Potassium: 4.1 mmol/L (ref 3.5–5.2)
Sodium: 139 mmol/L (ref 134–144)
TOTAL PROTEIN: 7.3 g/dL (ref 6.0–8.5)

## 2018-10-03 LAB — CK ISOENZYMES
CK-BB: 0 %
CK-MB: 0 % (ref 0–3)
CK-MM: 95 % — AB (ref 97–100)
Macro Type 1: 5 % — ABNORMAL HIGH
Macro Type 2: 0 %
Total CK: 411 U/L — ABNORMAL HIGH (ref 24–173)

## 2018-10-03 LAB — LIPID PANEL
CHOL/HDL RATIO: 1.9 ratio (ref 0.0–4.4)
Cholesterol, Total: 216 mg/dL — ABNORMAL HIGH (ref 100–199)
HDL: 111 mg/dL (ref >39)
LDL CALC: 93 mg/dL (ref 0–99)
TRIGLYCERIDES: 58 mg/dL (ref 0–149)
VLDL CHOLESTEROL CAL: 12 mg/dL (ref 5–40)

## 2018-10-03 LAB — CBC
HEMATOCRIT: 38.6 % (ref 34.0–46.6)
Hemoglobin: 13.1 g/dL (ref 11.1–15.9)
MCH: 28.6 pg (ref 26.6–33.0)
MCHC: 33.9 g/dL (ref 31.5–35.7)
MCV: 84 fL (ref 79–97)
Platelets: 311 10*3/uL (ref 150–450)
RBC: 4.58 x10E6/uL (ref 3.77–5.28)
RDW: 14.3 % (ref 11.7–15.4)
WBC: 6.4 10*3/uL (ref 3.4–10.8)

## 2018-10-03 LAB — HEMOGLOBIN A1C
ESTIMATED AVERAGE GLUCOSE: 128 mg/dL
Hgb A1c MFr Bld: 6.1 % — ABNORMAL HIGH (ref 4.8–5.6)

## 2018-10-03 LAB — TSH: TSH: 0.893 u[IU]/mL (ref 0.450–4.500)

## 2018-10-21 ENCOUNTER — Ambulatory Visit: Payer: BLUE CROSS/BLUE SHIELD | Admitting: Physical Therapy

## 2018-11-05 ENCOUNTER — Telehealth: Payer: Self-pay | Admitting: Family Medicine

## 2018-11-05 NOTE — Telephone Encounter (Signed)
Called and spoke with pt regarding their appt scheduled with Dr. Stallings on 4/15. I was able to get the pt rescheduled to 4/13 with Dr. Stallings. I advised of time and late policy. Pt acknowledged.  °

## 2018-11-09 DIAGNOSIS — G8929 Other chronic pain: Secondary | ICD-10-CM | POA: Diagnosis not present

## 2018-11-09 DIAGNOSIS — M545 Low back pain: Secondary | ICD-10-CM | POA: Diagnosis not present

## 2018-11-09 DIAGNOSIS — M25552 Pain in left hip: Secondary | ICD-10-CM | POA: Diagnosis not present

## 2018-11-27 DIAGNOSIS — M25552 Pain in left hip: Secondary | ICD-10-CM | POA: Diagnosis not present

## 2018-11-27 DIAGNOSIS — M545 Low back pain: Secondary | ICD-10-CM | POA: Diagnosis not present

## 2018-11-27 DIAGNOSIS — G8929 Other chronic pain: Secondary | ICD-10-CM | POA: Diagnosis not present

## 2018-12-04 DIAGNOSIS — M545 Low back pain: Secondary | ICD-10-CM | POA: Diagnosis not present

## 2018-12-04 DIAGNOSIS — M25552 Pain in left hip: Secondary | ICD-10-CM | POA: Diagnosis not present

## 2018-12-04 DIAGNOSIS — G8929 Other chronic pain: Secondary | ICD-10-CM | POA: Diagnosis not present

## 2018-12-25 DIAGNOSIS — M545 Low back pain: Secondary | ICD-10-CM | POA: Diagnosis not present

## 2018-12-25 DIAGNOSIS — M25552 Pain in left hip: Secondary | ICD-10-CM | POA: Diagnosis not present

## 2018-12-25 DIAGNOSIS — G8929 Other chronic pain: Secondary | ICD-10-CM | POA: Diagnosis not present

## 2018-12-28 ENCOUNTER — Telehealth: Payer: BLUE CROSS/BLUE SHIELD | Admitting: Family Medicine

## 2018-12-30 ENCOUNTER — Ambulatory Visit: Payer: BLUE CROSS/BLUE SHIELD | Admitting: Family Medicine

## 2019-01-01 DIAGNOSIS — M25552 Pain in left hip: Secondary | ICD-10-CM | POA: Diagnosis not present

## 2019-01-01 DIAGNOSIS — M545 Low back pain: Secondary | ICD-10-CM | POA: Diagnosis not present

## 2019-01-01 DIAGNOSIS — G8929 Other chronic pain: Secondary | ICD-10-CM | POA: Diagnosis not present

## 2019-01-05 DIAGNOSIS — G8929 Other chronic pain: Secondary | ICD-10-CM | POA: Diagnosis not present

## 2019-01-05 DIAGNOSIS — M545 Low back pain: Secondary | ICD-10-CM | POA: Diagnosis not present

## 2019-01-05 DIAGNOSIS — M25552 Pain in left hip: Secondary | ICD-10-CM | POA: Diagnosis not present

## 2019-01-15 DIAGNOSIS — M25552 Pain in left hip: Secondary | ICD-10-CM | POA: Diagnosis not present

## 2019-01-15 DIAGNOSIS — M545 Low back pain: Secondary | ICD-10-CM | POA: Diagnosis not present

## 2019-01-15 DIAGNOSIS — G8929 Other chronic pain: Secondary | ICD-10-CM | POA: Diagnosis not present

## 2019-02-03 DIAGNOSIS — H269 Unspecified cataract: Secondary | ICD-10-CM | POA: Diagnosis not present

## 2019-02-03 DIAGNOSIS — I252 Old myocardial infarction: Secondary | ICD-10-CM | POA: Diagnosis not present

## 2019-02-03 DIAGNOSIS — Z1331 Encounter for screening for depression: Secondary | ICD-10-CM | POA: Diagnosis not present

## 2019-02-03 DIAGNOSIS — M791 Myalgia, unspecified site: Secondary | ICD-10-CM | POA: Diagnosis not present

## 2019-02-03 DIAGNOSIS — I2510 Atherosclerotic heart disease of native coronary artery without angina pectoris: Secondary | ICD-10-CM | POA: Diagnosis not present

## 2019-02-04 DIAGNOSIS — E7800 Pure hypercholesterolemia, unspecified: Secondary | ICD-10-CM | POA: Diagnosis not present

## 2019-02-04 DIAGNOSIS — R7301 Impaired fasting glucose: Secondary | ICD-10-CM | POA: Diagnosis not present

## 2019-03-23 ENCOUNTER — Other Ambulatory Visit: Payer: Self-pay | Admitting: Family Medicine

## 2019-07-09 ENCOUNTER — Other Ambulatory Visit: Payer: Self-pay | Admitting: Family Medicine

## 2019-07-09 DIAGNOSIS — Z1231 Encounter for screening mammogram for malignant neoplasm of breast: Secondary | ICD-10-CM

## 2019-08-05 DIAGNOSIS — I2510 Atherosclerotic heart disease of native coronary artery without angina pectoris: Secondary | ICD-10-CM | POA: Diagnosis not present

## 2019-08-05 DIAGNOSIS — I252 Old myocardial infarction: Secondary | ICD-10-CM | POA: Diagnosis not present

## 2019-08-05 DIAGNOSIS — Z0000 Encounter for general adult medical examination without abnormal findings: Secondary | ICD-10-CM | POA: Diagnosis not present

## 2019-08-05 DIAGNOSIS — I119 Hypertensive heart disease without heart failure: Secondary | ICD-10-CM | POA: Diagnosis not present

## 2019-08-05 DIAGNOSIS — E1151 Type 2 diabetes mellitus with diabetic peripheral angiopathy without gangrene: Secondary | ICD-10-CM | POA: Diagnosis not present

## 2019-08-05 DIAGNOSIS — Z23 Encounter for immunization: Secondary | ICD-10-CM | POA: Diagnosis not present

## 2019-08-05 DIAGNOSIS — M8589 Other specified disorders of bone density and structure, multiple sites: Secondary | ICD-10-CM | POA: Diagnosis not present

## 2019-08-20 DIAGNOSIS — Z1239 Encounter for other screening for malignant neoplasm of breast: Secondary | ICD-10-CM | POA: Diagnosis not present

## 2019-08-20 DIAGNOSIS — Z1231 Encounter for screening mammogram for malignant neoplasm of breast: Secondary | ICD-10-CM | POA: Diagnosis not present

## 2019-11-13 ENCOUNTER — Ambulatory Visit: Payer: BLUE CROSS/BLUE SHIELD | Attending: Internal Medicine

## 2019-11-13 DIAGNOSIS — Z23 Encounter for immunization: Secondary | ICD-10-CM | POA: Insufficient documentation

## 2019-11-13 NOTE — Progress Notes (Signed)
   Covid-19 Vaccination Clinic  Name:  Casey Peterson    MRN: [id number] DOB: Sep 08, 1956  11/13/2019  [name] was observed post Covid-19 immunization for 15 minutes without incidence. She was provided with Vaccine Information Sheet and instruction to access the V-Safe system.   [name] was instructed to call 911 with any severe reactions post vaccine: Marland Kitchen Difficulty breathing  . Swelling of your face and throat  . A fast heartbeat  . A bad rash all over your body  . Dizziness and weakness    Immunizations Administered    Name Date Dose VIS Date Route   Pfizer COVID-19 Vaccine 11/13/2019  4:38 PM 0.3 mL 08/27/2019 Intramuscular   Manufacturer: [employer], [employer]   Lot: [id number]   NDC: [address]-2

## 2019-12-04 ENCOUNTER — Ambulatory Visit: Payer: Self-pay | Attending: Internal Medicine

## 2019-12-04 DIAGNOSIS — Z23 Encounter for immunization: Secondary | ICD-10-CM

## 2019-12-04 NOTE — Progress Notes (Signed)
   Covid-19 Vaccination Clinic  Name:  Casey Peterson    MRN: [id number] DOB: 1956-02-24  12/04/2019  Casey Peterson was observed post Covid-19 immunization for 15 minutes without incident. She was provided with Vaccine Information Sheet and instruction to access the V-Safe system.   Casey Peterson was instructed to call 911 with any severe reactions post vaccine: Marland Kitchen Difficulty breathing  . Swelling of face and throat  . A fast heartbeat  . A bad rash all over body  . Dizziness and weakness   Immunizations Administered    Name Date Dose VIS Date Route   Pfizer COVID-19 Vaccine 12/04/2019 10:41 AM 0.3 mL 08/27/2019 Intramuscular   Manufacturer: [employer], [employer]   Lot: [id number]   NDC: [address]-3

## 2019-12-08 ENCOUNTER — Ambulatory Visit: Payer: BLUE CROSS/BLUE SHIELD

## 2019-12-14 DIAGNOSIS — M5126 Other intervertebral disc displacement, lumbar region: Secondary | ICD-10-CM | POA: Insufficient documentation

## 2019-12-24 DIAGNOSIS — M8580 Other specified disorders of bone density and structure, unspecified site: Secondary | ICD-10-CM | POA: Insufficient documentation

## 2019-12-29 DIAGNOSIS — J302 Other seasonal allergic rhinitis: Secondary | ICD-10-CM | POA: Insufficient documentation

## 2020-01-04 ENCOUNTER — Other Ambulatory Visit: Payer: Self-pay

## 2020-01-04 ENCOUNTER — Encounter: Payer: Self-pay | Admitting: Physician Assistant

## 2020-01-04 ENCOUNTER — Ambulatory Visit: Payer: Medicare Other | Admitting: Physician Assistant

## 2020-01-04 VITALS — BP 134/78 | Temp 97.7°F | Ht 61.0 in | Wt 183.4 lb

## 2020-01-04 DIAGNOSIS — J302 Other seasonal allergic rhinitis: Secondary | ICD-10-CM

## 2020-01-04 DIAGNOSIS — I2510 Atherosclerotic heart disease of native coronary artery without angina pectoris: Secondary | ICD-10-CM

## 2020-01-04 DIAGNOSIS — E785 Hyperlipidemia, unspecified: Secondary | ICD-10-CM

## 2020-01-04 DIAGNOSIS — I10 Essential (primary) hypertension: Secondary | ICD-10-CM

## 2020-01-04 MED ORDER — FLUTICASONE PROPIONATE 50 MCG/ACT NA SUSP: 2.0000 | Freq: Every day | NASAL | 2 refills | Status: AC | PRN

## 2020-01-04 NOTE — Progress Notes (Signed)
Cardiology Office Note:    Date:  01/06/2020   ID:  Casey Peterson, DOB [birth year], MRN [id number]  PCP:  Nathaneil Canary, PA-C  Cardiologist:  Thurmon Fair, MD  Electrophysiologist:  None   Referring MD: Nathaneil Canary, PA-C   Chief Complaint  Patient presents with  . Follow-up    seen for Dr. Royann Peterson    History of Present Illness:    Casey Peterson is a 64 y.o. female with a hx of CAD, HTN, HLD, and GERD.  Patient was admitted with NSTEMI in October 2018.  Cardiac catheterization performed only demonstrated 55% ostial ramus lesion which was a small caliber and not amenable to PCI.  It was thought that this small vessel was possibly the culprit lesion and stabilized with aspirin and heparin.  LVEF was normal at 55 to 65%.  Patient was eventually discharged on aspirin and Plavix.  Patient presents today for cardiology office visit.  Unfortunately she lost her 37 year old son this past weekend from heart attack.  At the same time she also mentions her son has uncontrolled atrial fibrillation as well. The passing of her son behind 2 grandchildren.  Her daughter has high blood pressure however no cardiac history.  As for [name], she has not had any recent chest discomfort or shortness of breath.  She went back to working at [employer].  She has no lower extremity edema, orthopnea or PND.  She can follow-up in 6 months.  We discussed the fact that she can come off of Plavix, she would like to try that.  She did request a prescription for Flonase for her seasonal allergy.    Past Medical History:  Diagnosis Date  . Allergy   . CAD (coronary artery disease)    a. 06/2017: NSTEMI with cath showing 55% Ost Ramus stenosis (too small for PCI). LVEF normal.   . Cataract   . GERD (gastroesophageal reflux disease)   . Headache   . Herniated intervertebral disc of lumbar spine   . Hypercholesteremia   . Hypertension     Past Surgical History:  Procedure Laterality Date  . CARDIAC  CATHETERIZATION  06/17/2017  . LEFT HEART CATH AND CORONARY ANGIOGRAPHY N/A 06/17/2017   Procedure: LEFT HEART CATH AND CORONARY ANGIOGRAPHY;  Surgeon: Marykay Lex, MD;  Location: [facility] INVASIVE CV LAB;  Service: Cardiovascular;  Laterality: N/A;    Current Medications: Current Meds  Medication Sig  . aspirin EC 81 MG EC tablet Take 1 tablet (81 mg total) by mouth daily.  . fluticasone (FLONASE) 50 MCG/ACT nasal spray Place 2 sprays into both nostrils daily as needed for allergies. Send future refills to PCP  . hydrochlorothiazide (HYDRODIURIL) 25 MG tablet TAKE 1/2 (ONE-HALF) TABLET BY MOUTH ONCE DAILY  . loratadine (CLARITIN) 10 MG tablet Take 1 tablet (10 mg total) by mouth daily.  Marland Kitchen losartan (COZAAR) 50 MG tablet TAKE 1 TABLET BY MOUTH ONCE DAILY  . Multiple Vitamin (MULTIVITAMIN WITH MINERALS) TABS tablet Take 1 tablet by mouth daily.  . pantoprazole (PROTONIX) 20 MG tablet Take 1 tablet (20 mg total) by mouth daily.  . rosuvastatin (CRESTOR) 40 MG tablet Take by mouth.  . vitamin C (ASCORBIC ACID) 500 MG tablet Take 500 mg by mouth daily.  . [DISCONTINUED] clopidogrel (PLAVIX) 75 MG tablet TAKE 1 TABLET BY MOUTH ONCE DAILY  . [DISCONTINUED] fluticasone (FLONASE) 50 MCG/ACT nasal spray Place 2 sprays into both nostrils daily. (Patient taking differently: Place 2 sprays into both  nostrils daily as needed for allergies. )     Allergies:   Flagyl [metronidazole]   Social History   Socioeconomic History  . Marital status: Married    Spouse name: Aylinn Rydberg  . Number of children: 2  . Years of education: Not on file  . Highest education level: Not on file  Occupational History  . Occupation: nutrition    Employer: Runner, broadcasting/film[employer]video  Tobacco Use  . Smoking status: Never Smoker  . Smokeless tobacco: Former Neurosurgeon    Types: Snuff  Substance and Sexual Activity  . Alcohol use: No  . Drug use: Yes    Types: "Crack" cocaine    Comment: "in the 80s"  . Sexual activity: Yes     Partners: Male  Other Topics Concern  . Not on file  Social History Narrative  . Not on file   Social Determinants of Health   Financial Resource Strain:   . Difficulty of Paying Living Expenses:   Food Insecurity:   . Worried About Programme researcher, broadcasting/film/video in the Last Year:   . Barista in the Last Year:   Transportation Needs:   . Freight forwarder (Medical):   Marland Kitchen Lack of Transportation (Non-Medical):   Physical Activity:   . Days of Exercise per Week:   . Minutes of Exercise per Session:   Stress:   . Feeling of Stress :   Social Connections:   . Frequency of Communication with Friends and Family:   . Frequency of Social Gatherings with Friends and Family:   . Attends Religious Services:   . Active Member of Clubs or Organizations:   . Attends Banker Meetings:   Marland Kitchen Marital Status:      Family History: The patient's family history includes Breast cancer in her sister; Crohn's disease in her sister; Diabetes in her mother, sister, sister, sister, and sister; Heart disease in her brother and mother; Hyperlipidemia in her mother and sister; Hypertension in her father and mother; Kidney cancer in her mother; Stroke in her father, mother, and sister; Thyroid disease in her sister. There is no history of Colon cancer, Stomach cancer, Esophageal cancer, Rectal cancer, Liver cancer, or Colon polyps.  ROS:   Please see the history of present illness.     All other systems reviewed and are negative.  EKGs/Labs/Other Studies Reviewed:    The following studies were reviewed today:  Cath 06/17/2017  Ost Ramus lesion, 55 %stenosed.  The left ventricular systolic function is normal. The left ventricular ejection fraction is 55-65% by visual estimate.  LV end diastolic pressure is moderately elevated.   Angiographically, the only potential culprit lesion is the ostial ramus lesion that is in a small caliber vessel and not PCI targets based on size and location of  the lesion. Angiographically the lesion does not appear to be flow-limiting at present. This could have potentially been the culprit lesion and was stabilized with aspirin and heparin.  No PCI option. However, given presentation of ACS would consider treatment with aspirin and Plavix for one yearper guidelines.   Plan: Return to nursing unit for ongoing care & TR band removal. Defer further plan to primary service, however I have discussed results with Dr.Croitoru  EKG:  EKG is ordered today.  The ekg ordered today demonstrates normal sinus rhythm without significant ST-T wave changes, poor R wave progression in the anterior leads.  Recent Labs: No results found for requested labs within last 8760 hours.  Recent Lipid Panel    Component Value Date/Time   CHOL 216 (H) 09/28/2018 1014   TRIG 58 09/28/2018 1014   HDL 111 09/28/2018 1014   CHOLHDL 1.9 09/28/2018 1014   CHOLHDL 1.8 12/14/2014 0939   VLDL 12 12/14/2014 0939   LDLCALC 93 09/28/2018 1014    Physical Exam:    VS:  BP 134/78   Temp 97.7 F (36.5 C) Comment (Src): Forehead  Ht 5\' 1"  (1.549 m)   Wt 183 lb 6.4 oz (83.2 kg)   SpO2 96%   BMI 34.65 kg/m     Wt Readings from Last 3 Encounters:  01/04/20 183 lb 6.4 oz (83.2 kg)  09/28/18 183 lb 12.8 oz (83.4 kg)  05/07/18 181 lb (82.1 kg)     GEN:  Well nourished, well developed in no acute distress HEENT: Normal NECK: No JVD; No carotid bruits LYMPHATICS: No lymphadenopathy CARDIAC: RRR, no murmurs, rubs, gallops RESPIRATORY:  Clear to auscultation without rales, wheezing or rhonchi  ABDOMEN: Soft, non-tender, non-distended MUSCULOSKELETAL:  No edema; No deformity  SKIN: Warm and dry NEUROLOGIC:  Alert and oriented x 3 PSYCHIATRIC:  Normal affect   ASSESSMENT:    1. Coronary artery disease involving native coronary artery of native heart without angina pectoris   2. Other seasonal allergic rhinitis   3. Essential hypertension   4. Hyperlipidemia LDL goal  <70    PLAN:    In order of problems listed above:  1. CAD: Denies any recent chest pain.  Continue aspirin and Crestor  2. Hypertension: Blood pressure well controlled  3. Hyperlipidemia: Continue on Crestor  4. Seasonal allergies: Prescribed Flonase.   Medication Adjustments/Labs and Tests Ordered: Current medicines are reviewed at length with the patient today.  Concerns regarding medicines are outlined above.  Orders Placed This Encounter  Procedures  . EKG 12-Lead   Meds ordered this encounter  Medications  . fluticasone (FLONASE) 50 MCG/ACT nasal spray    Sig: Place 2 sprays into both nostrils daily as needed for allergies. Send future refills to PCP    Dispense:  15.8 mL    Refill:  2    Patient Instructions  Medication Instructions:   DISCONTINUE Plavix  *If you need a refill on your cardiac medications before your next appointment, please call your pharmacy*  Lab Work: NONE ordered at this time of appointment   If you have labs (blood work) drawn today and your tests are completely normal, you will receive your results only by: 05/09/18 MyChart Message (if you have MyChart) OR . A paper copy in the mail If you have any lab test that is abnormal or we need to change your treatment, we will call you to review the results.  Testing/Procedures: NONE ordered at this time of appointment   Follow-Up: At [facility], you and your health needs are our priority.  As part of our continuing mission to provide you with exceptional heart care, we have created designated Provider Care Teams.  These Care Teams include your primary Cardiologist (physician) and Advanced Practice Providers (APPs -  Physician Assistants and Nurse Practitioners) who all work together to provide you with the care you need, when you need it.  We recommend signing up for the patient portal called "MyChart".  Sign up information is provided on this After Visit Summary.  MyChart is used to connect with  patients for Virtual Visits (Telemedicine).  Patients are able to view lab/test results, encounter notes, upcoming appointments, etc.  Non-urgent  messages can be sent to your provider as well.   To learn more about what you can do with MyChart, go to [email or web address].    Your next appointment:   6 month(s)  The format for your next appointment:   In Person  Provider:   Sanda Klein, MD  Other Instructions      Signed, Almyra Deforest, [place]  01/06/2020 10:59 PM    [place]

## 2020-01-04 NOTE — Patient Instructions (Signed)
Medication Instructions:   DISCONTINUE Plavix  *If you need a refill on your cardiac medications before your next appointment, please call your pharmacy*  Lab Work: NONE ordered at this time of appointment   If you have labs (blood work) drawn today and your tests are completely normal, you will receive your results only by: Marland Kitchen MyChart Message (if you have MyChart) OR . A paper copy in the mail If you have any lab test that is abnormal or we need to change your treatment, we will call you to review the results.  Testing/Procedures: NONE ordered at this time of appointment   Follow-Up: At [facility], you and your health needs are our priority.  As part of our continuing mission to provide you with exceptional heart care, we have created designated Provider Care Teams.  These Care Teams include your primary Cardiologist (physician) and Advanced Practice Providers (APPs -  Physician Assistants and Nurse Practitioners) who all work together to provide you with the care you need, when you need it.  We recommend signing up for the patient portal called "MyChart".  Sign up information is provided on this After Visit Summary.  MyChart is used to connect with patients for Virtual Visits (Telemedicine).  Patients are able to view lab/test results, encounter notes, upcoming appointments, etc.  Non-urgent messages can be sent to your provider as well.   To learn more about what you can do with MyChart, go to [email or web address].    Your next appointment:   6 month(s)  The format for your next appointment:   In Person  Provider:   Thurmon Fair, MD  Other Instructions

## 2020-01-06 ENCOUNTER — Encounter: Payer: Self-pay | Admitting: Physician Assistant

## 2020-08-30 ENCOUNTER — Emergency Department (HOSPITAL_COMMUNITY)
Admission: EM | Admit: 2020-08-30 | Discharge: 2020-08-30 | Disposition: A | Discharge status: Home / Self Care | Payer: Medicare Other | Attending: Emergency Medicine | Admitting: Emergency Medicine

## 2020-08-30 ENCOUNTER — Encounter (HOSPITAL_COMMUNITY): Payer: Self-pay

## 2020-08-30 ENCOUNTER — Emergency Department (HOSPITAL_COMMUNITY): Payer: Medicare Other

## 2020-08-30 DIAGNOSIS — Z79899 Other long term (current) drug therapy: Secondary | ICD-10-CM | POA: Diagnosis not present

## 2020-08-30 DIAGNOSIS — Z7982 Long term (current) use of aspirin: Secondary | ICD-10-CM | POA: Insufficient documentation

## 2020-08-30 DIAGNOSIS — Z9861 Coronary angioplasty status: Secondary | ICD-10-CM | POA: Diagnosis not present

## 2020-08-30 DIAGNOSIS — W101XXA Fall (on)(from) sidewalk curb, initial encounter: Secondary | ICD-10-CM | POA: Diagnosis not present

## 2020-08-30 DIAGNOSIS — W19XXXA Unspecified fall, initial encounter: Secondary | ICD-10-CM

## 2020-08-30 DIAGNOSIS — M25532 Pain in left wrist: Secondary | ICD-10-CM

## 2020-08-30 DIAGNOSIS — I10 Essential (primary) hypertension: Secondary | ICD-10-CM | POA: Diagnosis not present

## 2020-08-30 DIAGNOSIS — I2510 Atherosclerotic heart disease of native coronary artery without angina pectoris: Secondary | ICD-10-CM | POA: Insufficient documentation

## 2020-08-30 DIAGNOSIS — S93402A Sprain of unspecified ligament of left ankle, initial encounter: Secondary | ICD-10-CM | POA: Diagnosis not present

## 2020-08-30 DIAGNOSIS — S99912A Unspecified injury of left ankle, initial encounter: Secondary | ICD-10-CM | POA: Diagnosis present

## 2020-08-30 DIAGNOSIS — M25562 Pain in left knee: Secondary | ICD-10-CM

## 2020-08-30 MED ORDER — NAPROXEN 375 MG PO TABS: 375.0000 mg | ORAL_TABLET | Freq: Two times a day (BID) | ORAL | 0 refills | Status: AC

## 2020-08-30 MED ORDER — KETOROLAC TROMETHAMINE 15 MG/ML IJ SOLN
7.5000 mg | Freq: Once | INTRAMUSCULAR | Status: AC
  Administered 2020-08-30: 7.5 mg via INTRAMUSCULAR
  Filled 2020-08-30: qty 1

## 2020-08-30 NOTE — ED Provider Notes (Signed)
[facility]-EMERGENCY DEPT Provider Note   CSN: [id number] Arrival date & time: 08/30/20  0741     History Chief Complaint  Patient presents with  . Fall    Casey Peterson is a 64 y.o. female presenting for evaluation after a fall.   Pt states yesterday she fell when getting into a car, hitting her L wrist, knee, and foot. She had mild pain yesterday, but today when she woke up her pain was worse. She has not taken anything for it, including tylneol or ibuprofen. She denies numbness. Pain is constant, worse with movement and palpation. Nothing makes it better. She did not hit her head or lose consciousness. She denies HA, neck pain, back pain, CP, abd pain. She is not on blood thinners.   HPI     Past Medical History:  Diagnosis Date  . Allergy   . CAD (coronary artery disease)    a. 06/2017: NSTEMI with cath showing 55% Ost Ramus stenosis (too small for PCI). LVEF normal.   . Cataract   . GERD (gastroesophageal reflux disease)   . Headache   . Herniated intervertebral disc of lumbar spine   . Hypercholesteremia   . Hypertension     Patient Active Problem List   Diagnosis Date Noted  . Antiplatelet or antithrombotic long-term use 04/20/2018  . Herniated disc, cervical   . Headache   . GERD (gastroesophageal reflux disease)   . Cataract   . Allergy   . CAD (coronary artery disease) 07/01/2017  . 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 History of non-ST elevation myocardial infarction (NSTEMI) 06/16/2017  . Tinnitus of both ears 01/14/2017  . Migraine without aura and without status migrainosus, not intractable 01/14/2017  . Essential hypertension 12/14/2014  . Hyperlipidemia LDL goal <70 12/14/2014    Past Surgical History:  Procedure Laterality Date  . CARDIAC CATHETERIZATION  06/17/2017  . LEFT HEART CATH AND CORONARY ANGIOGRAPHY N/A 06/17/2017   Procedure: LEFT HEART CATH AND CORONARY ANGIOGRAPHY;  Surgeon: Marykay Lex, MD;  Location: [facility]  INVASIVE CV LAB;  Service: Cardiovascular;  Laterality: N/A;     OB History   No obstetric history on file.     Family History  Problem Relation Age of Onset  . Diabetes Mother   . Heart disease Mother   . Hyperlipidemia Mother   . Hypertension Mother   . Stroke Mother   . Kidney cancer Mother   . Stroke Father   . Hypertension Father   . Hyperlipidemia Sister   . Stroke Sister   . Diabetes Sister   . Crohn's disease Sister   . Diabetes Sister   . Heart disease Brother   . Diabetes Sister   . Breast cancer Sister   . Diabetes Sister   . Thyroid disease Sister   . Colon cancer Neg Hx   . Stomach cancer Neg Hx   . Esophageal cancer Neg Hx   . Rectal cancer Neg Hx   . Liver cancer Neg Hx   . Colon polyps Neg Hx     Social History   Tobacco Use  . Smoking status: Never Smoker  . Smokeless tobacco: Former Neurosurgeon    Types: Snuff  Vaping Use  . Vaping Use: Former  Substance Use Topics  . Alcohol use: No  . Drug use: Yes    Types: "Crack" cocaine    Comment: "in the 80s"    Home Medications Prior to Admission medications   Medication Sig Start Date  End Date Taking? Authorizing Provider  aspirin EC 81 MG EC tablet Take 1 tablet (81 mg total) by mouth daily. 06/18/17   Strader, Lennart PallBrittany M, PA-C  fluticasone (FLONASE) 50 MCG/ACT nasal spray Place 2 sprays into both nostrils daily as needed for allergies. Send future refills to PCP 01/04/20   Azalee CourseMeng, Hao, PA  hydrochlorothiazide (HYDRODIURIL) 25 MG tablet TAKE 1/2 (ONE-HALF) TABLET BY MOUTH ONCE DAILY 07/10/18   Iran OuchStrader, GrenadaBrittany M, PA-C  loratadine (CLARITIN) 10 MG tablet Take 1 tablet (10 mg total) by mouth daily. 09/28/18   Doristine BosworthStallings, Zoe A, MD  losartan (COZAAR) 50 MG tablet TAKE 1 TABLET BY MOUTH ONCE DAILY 07/10/18   Iran OuchStrader, Lennart PallBrittany M, PA-C  Multiple Vitamin (MULTIVITAMIN WITH MINERALS) TABS tablet Take 1 tablet by mouth daily.    [provider]  naproxen (NAPROSYN) 375 MG tablet Take 1 tablet (375 mg total)  by mouth 2 (two) times daily with a meal for 7 days. 08/30/20 09/06/20  Brittiany Wiehe, PA-C  pantoprazole (PROTONIX) 20 MG tablet Take 1 tablet (20 mg total) by mouth daily. 09/28/18   Doristine BosworthStallings, Zoe A, MD  rosuvastatin (CRESTOR) 40 MG tablet Take by mouth. 10/31/19   [provider]  vitamin C (ASCORBIC ACID) 500 MG tablet Take 500 mg by mouth daily.    [provider]    Allergies    Flagyl [metronidazole]  Review of Systems   Review of Systems  Musculoskeletal: Positive for arthralgias and joint swelling.  Neurological: Negative for numbness.  Hematological: Does not bruise/bleed easily.  All other systems reviewed and are negative.   Physical Exam Updated Vital Signs BP 137/83 (BP Location: Left Arm)   Pulse 92   Temp 97.9 F (36.6 C) (Oral)   Resp 18   Ht 5' 1.5" (1.562 m)   Wt 81.6 kg   SpO2 96%   BMI 33.46 kg/m   Physical Exam Vitals and nursing note reviewed.  Constitutional:      General: She is not in acute distress.    Appearance: She is well-developed and well-nourished.     Comments: Resting in the bed in NAD  HENT:     Head: Normocephalic and atraumatic.  Eyes:     Extraocular Movements: Extraocular movements intact and EOM normal.     Conjunctiva/sclera: Conjunctivae normal.     Pupils: Pupils are equal, round, and reactive to light.  Neck:     Comments: No ttp of midline c-spine Cardiovascular:     Rate and Rhythm: Normal rate and regular rhythm.     Pulses: Normal pulses and intact distal pulses.  Pulmonary:     Effort: Pulmonary effort is normal. No respiratory distress.     Breath sounds: Normal breath sounds. No wheezing.  Abdominal:     General: There is no distension.     Palpations: Abdomen is soft. There is no mass.     Tenderness: There is no abdominal tenderness. There is no guarding or rebound.  Musculoskeletal:     Cervical back: Normal range of motion and neck supple.     Comments: Mild swelling of L radial  wrist. Pt able to range with discomfort. Radial pulses 2+ bilaterally. No pain at the anatomic snuff box. No ttp of the hand. Good distal sensation and cap refill.  Mild swelling of the L ankle. ttp over the lateral malleolus. Pedal pulses 2+ bilaterally. Achilles tendon palpable and intact. No pain of the foot or lower leg.  No obvious swelling or deformity  of the L knee. Mild ttp of the anterolateral L knee. Full active ROM of the knee.  No ttp of back or midline spine. Pelvis stable and nontender.   Skin:    General: Skin is warm and dry.     Capillary Refill: Capillary refill takes less than 2 seconds.  Neurological:     Mental Status: She is alert and oriented to person, place, and time.  Psychiatric:        Mood and Affect: Mood and affect normal.     ED Results / Procedures / Treatments   Labs (all labs ordered are listed, but only abnormal results are displayed) Labs Reviewed - No data to display  EKG None  Radiology DG Wrist Complete Left  Result Date: 08/30/2020 CLINICAL DATA:  Status post fall EXAM: LEFT WRIST - COMPLETE 3+ VIEW COMPARISON:  None. FINDINGS: There is no evidence of fracture or dislocation. At least moderate first carpometacarpal joint degenerative changes. No aggressive appearing focal bone abnormality. Soft tissues are unremarkable. IMPRESSION: No acute displaced fracture or dislocation left wrist. Electronically Signed   By: Tish Frederickson M.D.   On: 08/30/2020 08:21   DG Knee Complete 4 Views Left  Result Date: 08/30/2020 CLINICAL DATA:  Status post fall EXAM: LEFT KNEE - COMPLETE 4+ VIEW COMPARISON:  None. FINDINGS: No evidence of fracture, dislocation, or joint effusion. Mild medial tibiofemoral compartment degenerative changes. No evidence of severe arthropathy. No aggressive appearing focal bone abnormality. Soft tissues are unremarkable. IMPRESSION: No acute displaced fracture or dislocation left knee. Electronically Signed   By: Tish Frederickson M.D.    On: 08/30/2020 08:20   DG Foot Complete Left  Result Date: 08/30/2020 CLINICAL DATA:  Status post fall EXAM: LEFT FOOT - COMPLETE 3+ VIEW COMPARISON:  None. FINDINGS: There is no evidence of fracture or dislocation. There is no evidence of significant arthropathy. No aggressive appearing focal bone abnormality. Soft tissues are unremarkable. IMPRESSION: No acute displaced fracture or dislocation of the left foot. Electronically Signed   By: Tish Frederickson M.D.   On: 08/30/2020 08:19    Procedures Procedures (including critical care time)  Medications Ordered in ED Medications  ketorolac (TORADOL) 15 MG/ML injection 7.5 mg (7.5 mg Intramuscular Given 08/30/20 0847)    ED Course  I have reviewed the triage vital signs and the nursing notes.  Pertinent labs & imaging results that were available during my care of the patient were reviewed by me and considered in my medical decision making (see chart for details).    MDM Rules/Calculators/A&P                          Pt presenting for evaluation of L wrist, knee, and ankle pain after a fall yesterday. On exam, pt is neuro intact. Mild swelling of L wrist and ankle. xrays obtained in triage viewed and interpreted by me, no fx or dislocation. Discussed with pt. Discussed tx with nsaids and ankle brace. F/u with pcp as needed. At this time, pt appears safe for d/c. Return precautions given. Pt states she understands and agrees to plan.   Final Clinical Impression(s) / ED Diagnoses Final diagnoses:  Fall  Sprain of left ankle, unspecified ligament, initial encounter  Left wrist pain  Acute pain of left knee    Rx / DC Orders ED Discharge Orders         Ordered    naproxen (NAPROSYN) 375 MG tablet  2 times daily  with meals        08/30/20 0838           Alveria Apley, PA-C 08/30/20 7207    Gwyneth Sprout, MD 08/31/20 [phone]

## 2020-08-30 NOTE — Discharge Instructions (Signed)
Take naproxen 2 times a day with meals.  Do not take other anti-inflammatories at the same time (Advil, Motrin, ibuprofen, Aleve). You may supplement with Tylenol if you need further pain control. Use ice packs for pain and swelling.  Use the ankle brace as needed for pain.  Follow up with your primary care doctor as needed if pain is still severe.  Return to the ER if you develop numbness, severe worsening pain, or any new, worsening, or concerning symptoms.

## 2020-08-30 NOTE — ED Triage Notes (Signed)
Pt presents with c/o fall that occurred yesterday on a curb. Pt did not hit her head, no LOC. Pt reports that she has pain in her left knee, left wrist, and left foot.

## 2020-08-30 NOTE — Progress Notes (Signed)
Orthopedic Tech Progress Note Patient Details:  Casey Peterson 05-21-1956 [id number]  Ortho Devices Type of Ortho Device: ASO Ortho Device/Splint Location: left Ortho Device/Splint Interventions: Application   Post Interventions Patient Tolerated: Well Instructions Provided: Care of device   Saul Fordyce 08/30/2020, 9:01 AM

## 2020-11-18 ENCOUNTER — Other Ambulatory Visit: Payer: Self-pay | Admitting: Physician Assistant

## 2020-11-18 DIAGNOSIS — J302 Other seasonal allergic rhinitis: Secondary | ICD-10-CM

## 2021-03-23 ENCOUNTER — Ambulatory Visit: Payer: Medicare Other | Admitting: Cardiovascular Disease

## 2021-04-23 NOTE — Progress Notes (Signed)
Cardiology Office Note:    Date:  04/24/2021   ID:  Yuli Lanigan, DOB Apr 02, 1956, MRN [id number]  PCP:  Leonard Downing Referring MD: Leonard Downing    [facility]  Cardiologist:  Thurmon Fair, MD   Reason for visit: Follow-up  History of Present Illness:    Casey Peterson is a 65 y.o. female with a hx of CAD, hypertension, hyperlipidemia.  Patient was admitted with NSTEMI in October 2018.  Cardiac catheterization performed only demonstrated 55% ostial ramus lesion which was a small caliber and not amenable to PCI.  It was thought that this small vessel was possibly the culprit lesion and stabilized with aspirin and heparin.  LVEF was normal at 55 to 65%.  She was last seen by our office in April 2021 and was feeling well.  Her Plavix was discontinued.  Today she is doing well from a cardiovascular perspective.  She did have a rough summer. She states she had a bacteria infection in her stomach in May.  Then she caught COVID following a family reunion July 4 weekend.  She let herself recover and now she is finally gotten back to exercising regularly.  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  She states she has had some weight gain this summer since she is not in back to her work routine.  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  She denies chest pain, shortness of breath, syncope, orthopnea, PND.  She has occasional mild pedal edema.  She denies bleeding issues.  She states her primary care doctor said she is on the cusp of diabetes.  A1c was 6.5% in November 2020.   Prior CV studies: LEFT HEART CATH AND CORONARY ANGIOGRAPHY 06/17/2017  Ost Ramus lesion, 55 %stenosed.  The left ventricular systolic function is normal. The left ventricular ejection fraction is 55-65% by visual estimate.  LV end diastolic pressure is moderately elevated.  Angiographically, the only potential culprit lesion is the ostial ramus lesion that  is in a small caliber vessel and not PCI targets based on size and location of the lesion. Angiographically the lesion does not appear to be flow-limiting at present. This could have potentially been the culprit lesion and was stabilized with aspirin and heparin.  No PCI option. However, given presentation of ACS would consider treatment with aspirin and Plavix for one yearper guidelines.  Past Medical History:  Diagnosis Date   Allergy    CAD (coronary artery disease)    a. 06/2017: NSTEMI with cath showing 55% Ost Ramus stenosis (too small for PCI). LVEF normal.    Cataract    GERD (gastroesophageal reflux disease)    Headache    Herniated intervertebral disc of lumbar spine    Hypercholesteremia    Hypertension     Past Surgical History:  Procedure Laterality Date   CARDIAC CATHETERIZATION  06/17/2017   LEFT HEART CATH AND CORONARY ANGIOGRAPHY N/A 06/17/2017   Procedure: LEFT HEART CATH AND CORONARY ANGIOGRAPHY;  Surgeon: Marykay Lex, MD;  Location: [facility] INVASIVE CV LAB;  Service: Cardiovascular;  Laterality: N/A;    Current Medications: Current Meds  Medication Sig   aspirin EC 81 MG EC tablet Take 1 tablet (81 mg total) by mouth daily.   calcium carbonate (OS-CAL) 600 MG TABS tablet Take by mouth.   Cod Liver Oil CAPS Take 1 capsule by mouth daily.   fluticasone (FLONASE) 50 MCG/ACT nasal spray Place 2  sprays into both nostrils daily as needed for allergies. Send future refills to PCP   hydrochlorothiazide (HYDRODIURIL) 25 MG tablet TAKE 1/2 (ONE-HALF) TABLET BY MOUTH ONCE DAILY   losartan (COZAAR) 50 MG tablet TAKE 1 TABLET BY MOUTH ONCE DAILY   montelukast (SINGULAIR) 10 MG tablet Take 10 mg by mouth daily.   Multiple Vitamin (MULTIVITAMIN WITH MINERALS) TABS tablet Take 1 tablet by mouth daily.   pantoprazole (PROTONIX) 20 MG tablet Take 1 tablet (20 mg total) by mouth daily.   rosuvastatin (CRESTOR) 40 MG tablet Take by mouth.   triamcinolone cream (KENALOG) 0.1 %  Apply topically 2 (two) times daily as needed.   vitamin C (ASCORBIC ACID) 500 MG tablet Take 500 mg by mouth daily.     Allergies:   Lipitor [atorvastatin] and Flagyl [metronidazole]   Social History   Socioeconomic History   Marital status: Married    Spouse name: Laurian Edrington   Number of children: 2   Years of education: Not on file   Highest education level: Not on file  Occupational History   Occupation: nutrition    Employer: [employer]  Tobacco Use   Smoking status: Never   Smokeless tobacco: Former    Types: Snuff    Quit date: 01/15/2011  Vaping Use   Vaping Use: Former  Substance and Sexual Activity   Alcohol use: No   Drug use: Yes    Types: "Crack" cocaine    Comment: "in the 80s"   Sexual activity: Yes    Partners: Male  Other Topics Concern   Not on file  Social History Narrative   Not on file   Social Determinants of Health   Financial Resource Strain: Not on file  Food Insecurity: Not on file  Transportation Needs: Not on file  Physical Activity: Not on file  Stress: Not on file  Social Connections: Not on file     Family History: The patient's family history includes Breast cancer in her sister; Crohn's disease in her sister; Diabetes in her mother, sister, sister, sister, and sister; Heart disease in her brother and mother; Hyperlipidemia in her mother and sister; Hypertension in her father and mother; Kidney cancer in her mother; Stroke in her father, mother, and sister; Thyroid disease in her sister. There is no history of Colon cancer, Stomach cancer, Esophageal cancer, Rectal cancer, Liver cancer, or Colon polyps.  ROS:   Please see the history of present illness.     EKGs/Labs/Other Studies Reviewed:    Recent Labs: No results found for requested labs within last 8760 hours.  Recent Lipid Panel    Component Value Date/Time   CHOL 216 (H) 09/28/2018 1014   TRIG 58 09/28/2018 1014   HDL 111 09/28/2018 1014   CHOLHDL 1.9  09/28/2018 1014   CHOLHDL 1.8 12/14/2014 0939   VLDL 12 12/14/2014 0939   LDLCALC 93 09/28/2018 1014    Physical Exam:    VS:  BP 134/86   Pulse 80   Ht 5\' 2"  (1.575 m)   Wt 183 lb (83 kg)   SpO2 96%   BMI 33.47 kg/m     Wt Readings from Last 3 Encounters:  04/24/21 183 lb (83 kg)  08/30/20 180 lb (81.6 kg)  01/04/20 183 lb 6.4 oz (83.2 kg)     GEN:  Well nourished, well developed in no acute distress, overweight HEENT: Normal NECK: No JVD; No carotid bruits CARDIAC: RRR, no murmurs, rubs, gallops RESPIRATORY:  Clear to  auscultation without rales, wheezing or rhonchi  ABDOMEN: Soft, non-tender, non-distended MUSCULOSKELETAL: No edema; No deformity  SKIN: Warm and dry NEUROLOGIC:  Alert and oriented PSYCHIATRIC:  Normal affect   ASSESSMENT AND PLAN   CAD, no angina - Continue aspirin and statin. - Recommend improvement in diet, exercise and weight control to help her borderline high blood pressure, cholesterol and diabetes.  Hypertension, mildly elevated - BP 134/86 today.  We are obtaining a automatic cuff for her and giving her a blood pressure log.  If blood pressure is consistently over 130/80, we can adjust her medications. (will consider increasing HCTZ to a full tablet of the 25 mg). - Goal BP is <130/80.  Recommend DASH diet (high in vegetables, fruits, low-fat dairy products, whole grains, poultry, fish, and nuts and low in sweets, sugar-sweetened beverages, and red meats), salt restriction and increase physical activity.  Hyperlipidemia - LDL 89 in 06/2020.  Recheck fasting lipids.  She has already eaten today.  If LDL is not improved, will consider adding Zetia. - Discussed cholesterol lowering diets - Mediterranean diet, DASH diet, vegetarian diet, low-carbohydrate diet and avoidance of trans fats.  Discussed healthier choice substitutes.  Nuts, high-fiber foods, and fiber supplements may also improve lipids.    Obesity - Discussed how even a 5-10% weight  loss can have cardiovascular benefits.   - Recommend moderate intensity activity for 30 minutes 5 days/week and the DASH diet.  Disposition - Follow-up in 1 year with Dr. Royann Shivers or myself.   Medication Adjustments/Labs and Tests Ordered: Current medicines are reviewed at length with the patient today.  Concerns regarding medicines are outlined above.  Orders Placed This Encounter  Procedures   Lipid panel   No orders of the defined types were placed in this encounter.   Patient Instructions  Medication Instructions:  No Changes *If you need a refill on your cardiac medications before your next appointment, please call your pharmacy*   Lab Work: Lipid Panel ( To Be Done at a Later Date). If you have labs (blood work) drawn today and your tests are completely normal, you will receive your results only by: MyChart Message (if you have MyChart) OR A paper copy in the mail If you have any lab test that is abnormal or we need to change your treatment, we will call you to review the results.   Testing/Procedures: No Testing   Follow-Up: At [facility], you and your health needs are our priority.  As part of our continuing mission to provide you with exceptional heart care, we have created designated Provider Care Teams.  These Care Teams include your primary Cardiologist (physician) and Advanced Practice Providers (APPs -  Physician Assistants and Nurse Practitioners) who all work together to provide you with the care you need, when you need it.  Your next appointment:   1 year(s)  The format for your next appointment:   In Person  Provider:   You may see Thurmon Fair, MD or one of the following Advanced Practice Providers on your designated Care Team:   Juanda Crumble, PA-C Micah Flesher, PA-C or  Judy Pimple, [place]   Other Instructions If Blood Pressure above 130/80 on a regular basis, Please call the office.    Signed, Cannon Kettle, PA-C  04/24/2021 9:05  AM    [facility]

## 2021-04-24 ENCOUNTER — Encounter: Payer: Self-pay | Admitting: Physician Assistant

## 2021-04-24 ENCOUNTER — Ambulatory Visit: Payer: Medicare (Managed Care) | Admitting: Physician Assistant

## 2021-04-24 ENCOUNTER — Other Ambulatory Visit: Payer: Self-pay

## 2021-04-24 VITALS — BP 134/86 | HR 80 | Ht 62.0 in | Wt 183.0 lb

## 2021-04-24 DIAGNOSIS — E785 Hyperlipidemia, unspecified: Secondary | ICD-10-CM

## 2021-04-24 DIAGNOSIS — I10 Essential (primary) hypertension: Secondary | ICD-10-CM

## 2021-04-24 DIAGNOSIS — I2510 Atherosclerotic heart disease of native coronary artery without angina pectoris: Secondary | ICD-10-CM | POA: Diagnosis not present

## 2021-04-24 NOTE — Patient Instructions (Signed)
Medication Instructions:  No Changes *If you need a refill on your cardiac medications before your next appointment, please call your pharmacy*   Lab Work: Lipid Panel ( To Be Done at a Later Date). If you have labs (blood work) drawn today and your tests are completely normal, you will receive your results only by: MyChart Message (if you have MyChart) OR A paper copy in the mail If you have any lab test that is abnormal or we need to change your treatment, we will call you to review the results.   Testing/Procedures: No Testing   Follow-Up: At [facility], you and your health needs are our priority.  As part of our continuing mission to provide you with exceptional heart care, we have created designated Provider Care Teams.  These Care Teams include your primary Cardiologist (physician) and Advanced Practice Providers (APPs -  Physician Assistants and Nurse Practitioners) who all work together to provide you with the care you need, when you need it.  Your next appointment:   1 year(s)  The format for your next appointment:   In Person  Provider:   You may see Thurmon Fair, MD or one of the following Advanced Practice Providers on your designated Care Team:   Juanda Crumble, PA-C Micah Flesher, PA-C or  Judy Pimple, [place]   Other Instructions If Blood Pressure above 130/80 on a regular basis, Please call the office.

## 2022-05-14 ENCOUNTER — Ambulatory Visit: Payer: Medicare (Managed Care) | Attending: Cardiovascular Disease | Admitting: Cardiovascular Disease

## 2022-05-14 ENCOUNTER — Encounter: Payer: Self-pay | Admitting: Cardiovascular Disease

## 2022-05-14 VITALS — BP 122/82 | HR 73 | Ht 61.5 in | Wt 189.6 lb

## 2022-05-14 DIAGNOSIS — I10 Essential (primary) hypertension: Secondary | ICD-10-CM | POA: Diagnosis not present

## 2022-05-14 DIAGNOSIS — R7303 Prediabetes: Secondary | ICD-10-CM

## 2022-05-14 DIAGNOSIS — E785 Hyperlipidemia, unspecified: Secondary | ICD-10-CM | POA: Diagnosis not present

## 2022-05-14 DIAGNOSIS — I2510 Atherosclerotic heart disease of native coronary artery without angina pectoris: Secondary | ICD-10-CM | POA: Diagnosis not present

## 2022-05-14 NOTE — Progress Notes (Signed)
Cardiology Office Note:    Date:  05/14/2022   ID:  Casey Peterson, DOB 1956-03-16, MRN [id number]  PCP:  Casey Peterson   [facility] Providers Cardiologist:  Casey Fair, MD     Referring MD: Casey Canary, PA-C   Chief Complaint  Patient presents with   Coronary Artery Disease    History of Present Illness:    Casey Peterson is a 66 y.o. female with a hx of mild coronary artery disease, presenting as NSTEMI in 2018 where she was found to have a 50-60% narrowing in the ramus intermedius artery, left for medical therapy.  She also has systemic hypertension, prediabetes, hypercholesterolemia (also with very high HDL cholesterol) and severe obesity.  Since her last appointment she has not required any hospital visits or emergency room evaluations for chest pain.  She takes care of her own household without any angina or dyspnea with activity.  She has not had issues with orthopnea, PND and has only occasional mild and intermittent lower extremity edema.  She has not had dizziness, palpitations, syncope or focal neurological events and denies intermittent claudication.  Her labs were checked by her primary care physician (Dr. Knox Peterson).  Her LDL was "almost" in target range.  Her hemoglobin A1c remains in the prediabetes range at about 6.5%.  She is not taking any medications for diabetes.  She is on the maximum dose of rosuvastatin.  Past Medical History:  Diagnosis Date   Allergy    CAD (coronary artery disease)    a. 06/2017: NSTEMI with cath showing 55% Ost Ramus stenosis (too small for PCI). LVEF normal.    Cataract    GERD (gastroesophageal reflux disease)    Headache    Herniated intervertebral disc of lumbar spine    Hypercholesteremia    Hypertension     Past Surgical History:  Procedure Laterality Date   CARDIAC CATHETERIZATION  06/17/2017   LEFT HEART CATH AND CORONARY ANGIOGRAPHY N/A 06/17/2017   Procedure: LEFT HEART CATH AND CORONARY ANGIOGRAPHY;   Surgeon: Marykay Lex, MD;  Location: [facility] INVASIVE CV LAB;  Service: Cardiovascular;  Laterality: N/A;    Current Medications: Current Meds  Medication Sig   aspirin EC 81 MG EC tablet Take 1 tablet (81 mg total) by mouth daily.   butalbital-acetaminophen-caffeine (FIORICET) 50-325-40 MG tablet Take 1 tablet by mouth every 4 (four) hours as needed.   calcium carbonate (OS-CAL) 600 MG TABS tablet Take by mouth.   ferrous gluconate (FERGON) 324 MG tablet Take 324 mg by mouth daily.   fluticasone (FLONASE) 50 MCG/ACT nasal spray Place 2 sprays into both nostrils daily as needed for allergies. Send future refills to PCP   hydrochlorothiazide (HYDRODIURIL) 25 MG tablet TAKE 1/2 (ONE-HALF) TABLET BY MOUTH ONCE DAILY   losartan (COZAAR) 50 MG tablet TAKE 1 TABLET BY MOUTH ONCE DAILY   Multiple Vitamin (MULTIVITAMIN WITH MINERALS) TABS tablet Take 1 tablet by mouth daily.   omeprazole (PRILOSEC) 40 MG capsule Take 40 mg by mouth every morning.   rosuvastatin (CRESTOR) 40 MG tablet Take by mouth.   triamcinolone cream (KENALOG) 0.1 % Apply topically 2 (two) times daily as needed.   vitamin C (ASCORBIC ACID) 500 MG tablet Take 500 mg by mouth daily.     Allergies:   Lipitor [atorvastatin] and Flagyl [metronidazole]   Social History   Socioeconomic History   Marital status: Married    Spouse name: Casey Peterson   Number of children: 2  Years of education: Not on file   Highest education level: Not on file  Occupational History   Occupation: nutrition    Employer: [employer]  Tobacco Use   Smoking status: Never   Smokeless tobacco: Former    Types: Snuff    Quit date: 01/15/2011  Vaping Use   Vaping Use: Former  Substance and Sexual Activity   Alcohol use: No   Drug use: Yes    Types: "Crack" cocaine    Comment: "in the 80s"   Sexual activity: Yes    Partners: Male  Other Topics Concern   Not on file  Social History Narrative   Not on file   Social Determinants of  Health   Financial Resource Strain: Not on file  Food Insecurity: Not on file  Transportation Needs: Not on file  Physical Activity: Not on file  Stress: Not on file  Social Connections: Not on file     Family History: The patient's family history includes Breast cancer in her sister; Crohn's disease in her sister; Diabetes in her mother, sister, sister, sister, and sister; Heart disease in her brother and mother; Hyperlipidemia in her mother and sister; Hypertension in her father and mother; Kidney cancer in her mother; Stroke in her father, mother, and sister; Thyroid disease in her sister. There is no history of Colon cancer, Stomach cancer, Esophageal cancer, Rectal cancer, Liver cancer, or Colon polyps.  ROS:   Please see the history of present illness.    ] All other systems reviewed and are negative.  EKGs/Labs/Other Studies Reviewed:    The following studies were reviewed today:   EKG:  EKG is  ordered today.  The ekg ordered today demonstrates normal sinus rhythm, QS pattern in leads V1 and V2, unchanged from previous tracings, no acute repolarization abnormalities to suggest ischemia.  QTc 427 ms  Recent Labs: No results found for requested labs within last 365 days.  Recent Lipid Panel    Component Value Date/Time   CHOL 216 (H) 09/28/2018 1014   TRIG 58 09/28/2018 1014   HDL 111 09/28/2018 1014   CHOLHDL 1.9 09/28/2018 1014   CHOLHDL 1.8 12/14/2014 0939   VLDL 12 12/14/2014 0939   LDLCALC 93 09/28/2018 1014     Risk Assessment/Calculations:                Physical Exam:    VS:  BP 122/82 (BP Location: Left Arm, Patient Position: Sitting, Cuff Size: Large)   Pulse 73   Ht 5' 1.5" (1.562 m)   Wt 189 lb 9.6 oz (86 kg)   SpO2 97%   BMI 35.24 kg/m     Wt Readings from Last 3 Encounters:  05/14/22 189 lb 9.6 oz (86 kg)  04/24/21 183 lb (83 kg)  08/30/20 180 lb (81.6 kg)     GEN: Moderate to severely obese, well nourished, well developed in no acute  distress HEENT: Normal NECK: No JVD; No carotid bruits LYMPHATICS: No lymphadenopathy CARDIAC: RRR, no murmurs, rubs, gallops RESPIRATORY:  Clear to auscultation without rales, wheezing or rhonchi  ABDOMEN: Soft, non-tender, non-distended MUSCULOSKELETAL:  No edema; No deformity  SKIN: Warm and dry NEUROLOGIC:  Alert and oriented x 3 PSYCHIATRIC:  Normal affect   ASSESSMENT:    1. Coronary artery disease involving native coronary artery of native heart without angina pectoris   2. Prediabetes   3. Essential hypertension   4. Hyperlipidemia LDL goal <70    PLAN:    In  order of problems listed above:  CAD: Currently asymptomatic.  Had single-vessel disease at the time of cardiac catheterization and non-STEMI in 2018.  On aspirin and high-dose statin. preDM: Managed without medications.  Discussed the concept of glycemic index and healthy dietary changes, active lifestyle. HTN: Well-controlled. HLP: Get the most recent labs from Dr. Yetta Barre.  Target LDL less than 70.           Medication Adjustments/Labs and Tests Ordered: Current medicines are reviewed at length with the patient today.  Concerns regarding medicines are outlined above.  Orders Placed This Encounter  Procedures   EKG 12-Lead   No orders of the defined types were placed in this encounter.   Patient Instructions  Medication Instructions:  The current medical regimen is effective;  continue present plan and medications.  *If you need a refill on your cardiac medications before your next appointment, please call your pharmacy*   Follow-Up: At [facility], you and your health needs are our priority.  As part of our continuing mission to provide you with exceptional heart care, we have created designated Provider Care Teams.  These Care Teams include your primary Cardiologist (physician) and Advanced Practice Providers (APPs -  Physician Assistants and Nurse Practitioners) who all work together to  provide you with the care you need, when you need it.  We recommend signing up for the patient portal called "MyChart".  Sign up information is provided on this After Visit Summary.  MyChart is used to connect with patients for Virtual Visits (Telemedicine).  Patients are able to view lab/test results, encounter notes, upcoming appointments, etc.  Non-urgent messages can be sent to your provider as well.   To learn more about what you can do with MyChart, go to [email or web address].    Your next appointment:   12 month(s)  The format for your next appointment:   In Person  Provider:   Thurmon Fair, MD   Please send [place] your blood work results when you have them completed.          Signed, Casey Fair, MD  05/14/2022 2:58 PM    [facility]

## 2022-05-14 NOTE — Patient Instructions (Addendum)
Medication Instructions:  The current medical regimen is effective;  continue present plan and medications.  *If you need a refill on your cardiac medications before your next appointment, please call your pharmacy*   Follow-Up: At [facility], you and your health needs are our priority.  As part of our continuing mission to provide you with exceptional heart care, we have created designated Provider Care Teams.  These Care Teams include your primary Cardiologist (physician) and Advanced Practice Providers (APPs -  Physician Assistants and Nurse Practitioners) who all work together to provide you with the care you need, when you need it.  We recommend signing up for the patient portal called "MyChart".  Sign up information is provided on this After Visit Summary.  MyChart is used to connect with patients for Virtual Visits (Telemedicine).  Patients are able to view lab/test results, encounter notes, upcoming appointments, etc.  Non-urgent messages can be sent to your provider as well.   To learn more about what you can do with MyChart, go to [email or web address].    Your next appointment:   12 month(s)  The format for your next appointment:   In Person  Provider:   Thurmon Fair, MD   Please send [place] your blood work results when you have them completed.

## 2022-07-15 IMAGING — CR DG WRIST COMPLETE 3+V*L*
4 series · 4 of 4 positions shown · non-contrast
Comparison: None.

CLINICAL DATA: Status post fall

EXAM:
LEFT WRIST - COMPLETE 3+ VIEW

[x wrist pa left]
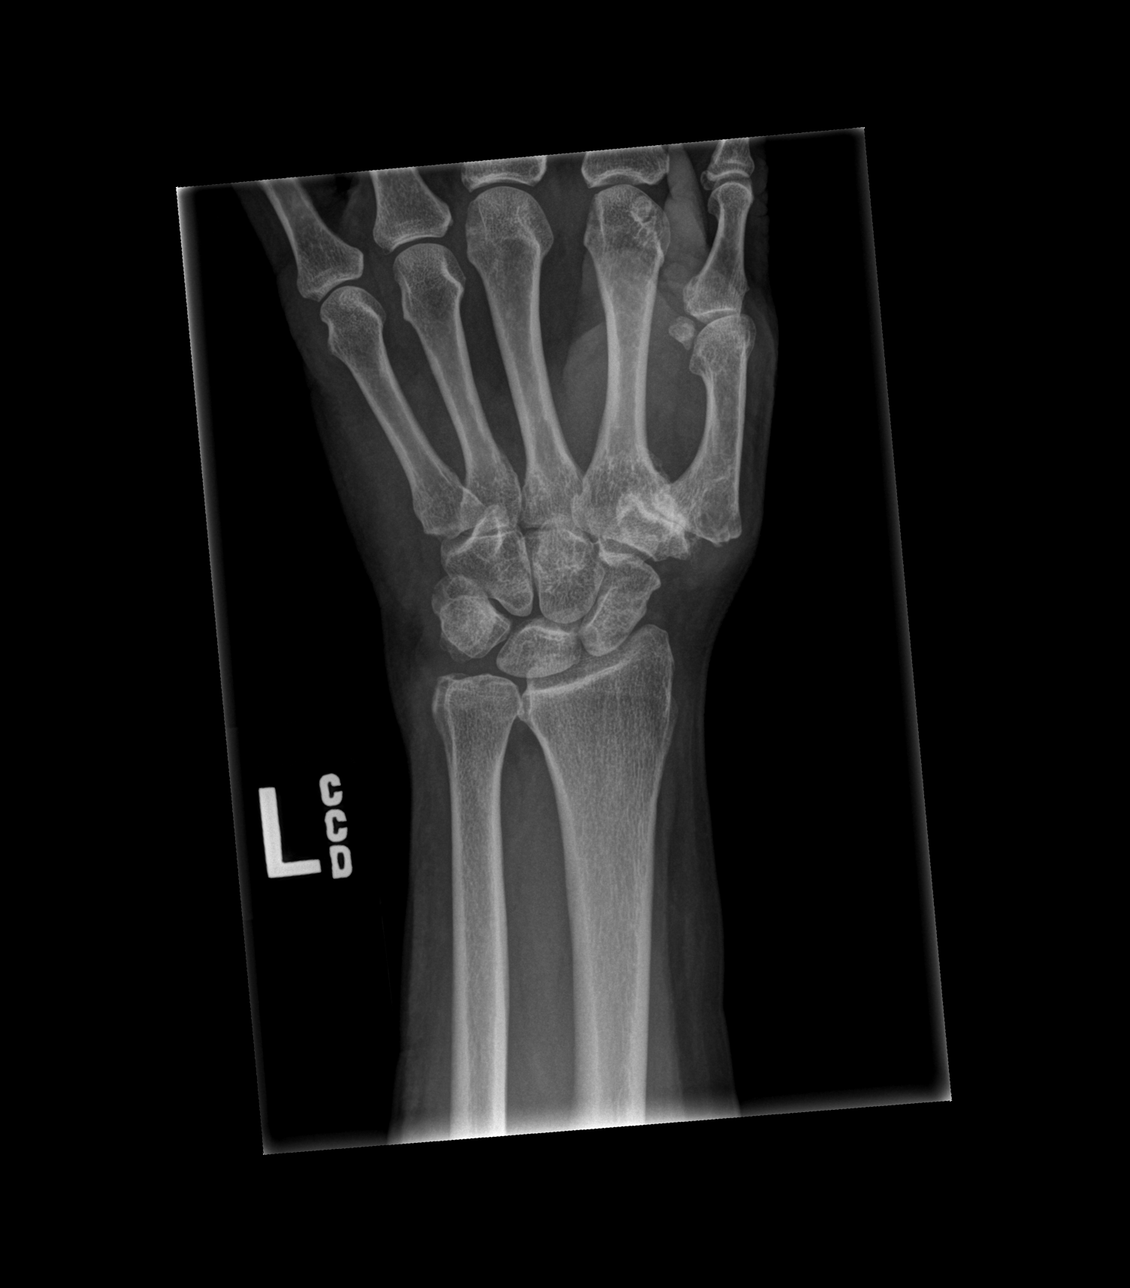

[x wrist obl left]
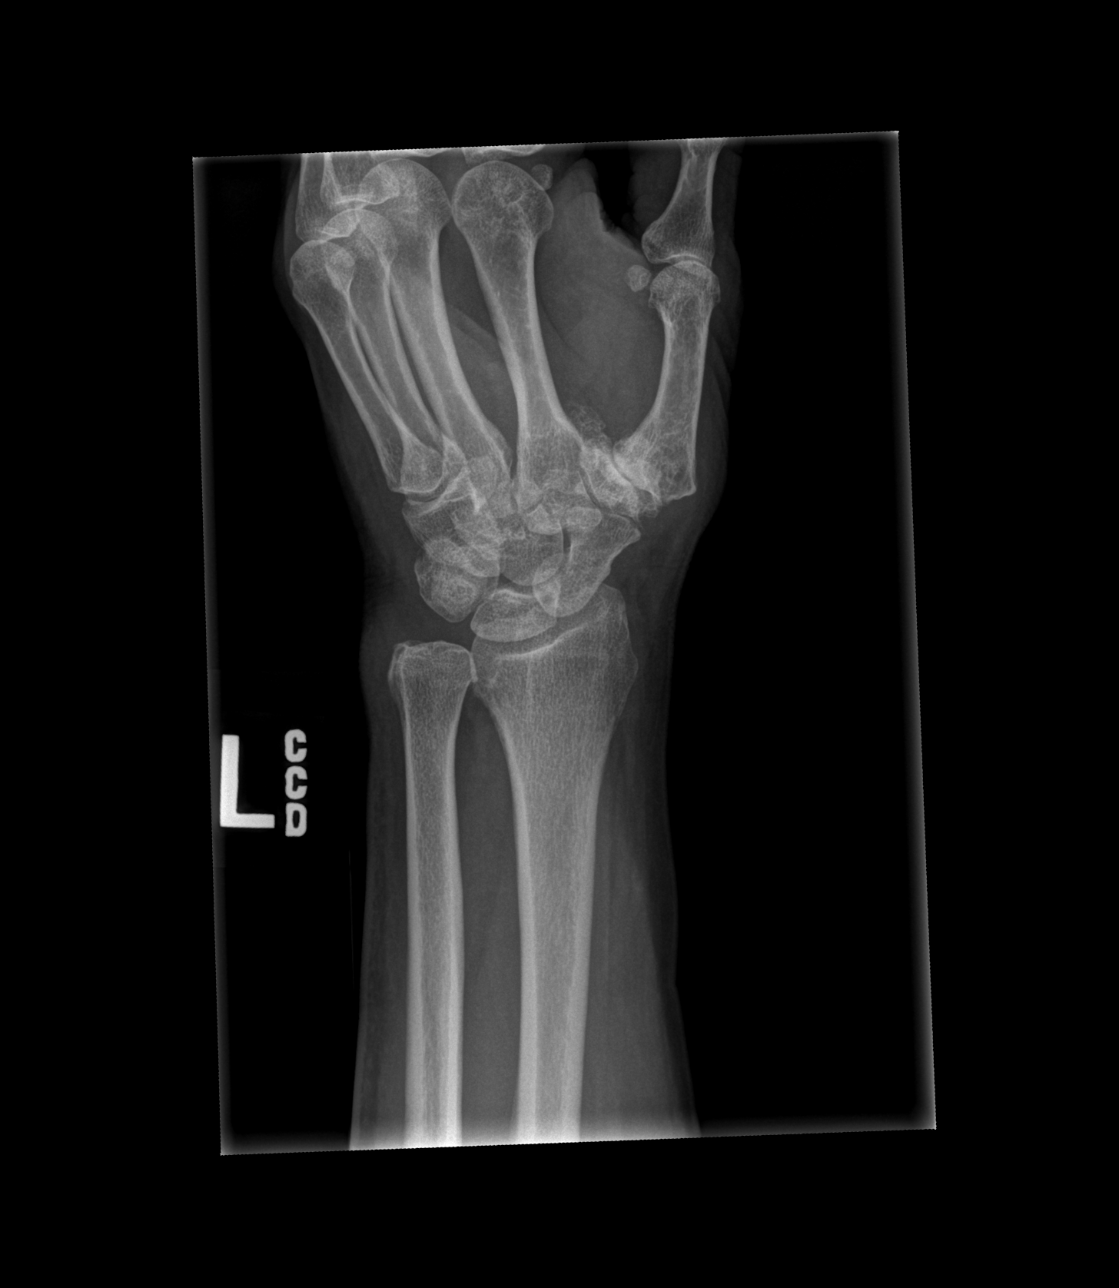

[x wrist lat left]
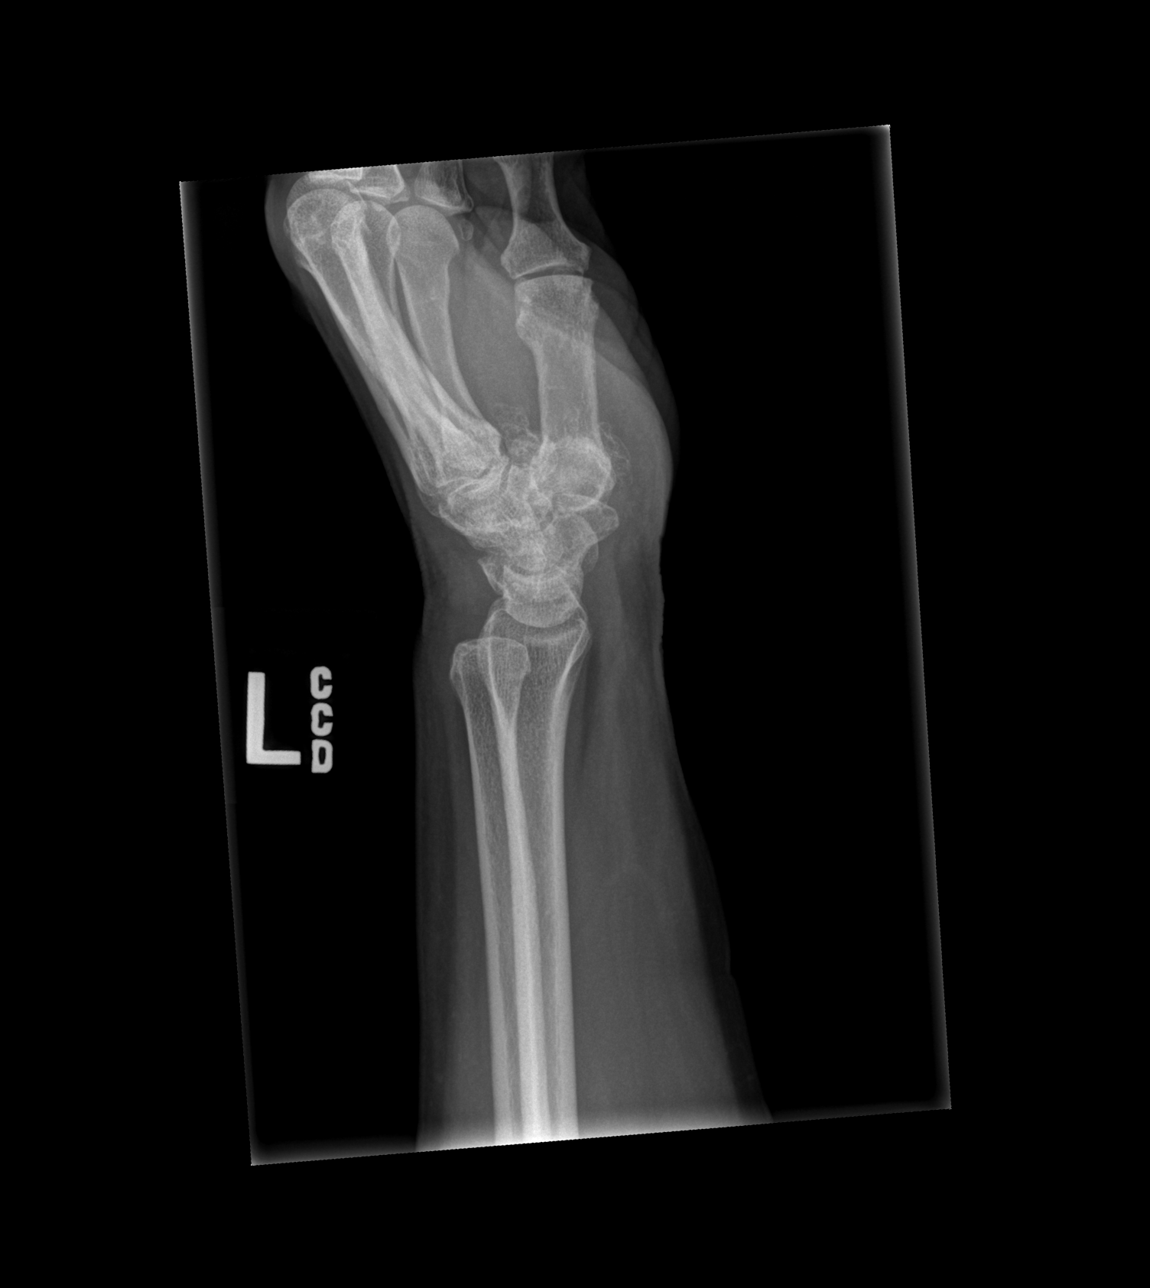

[x wrist navicular view left]
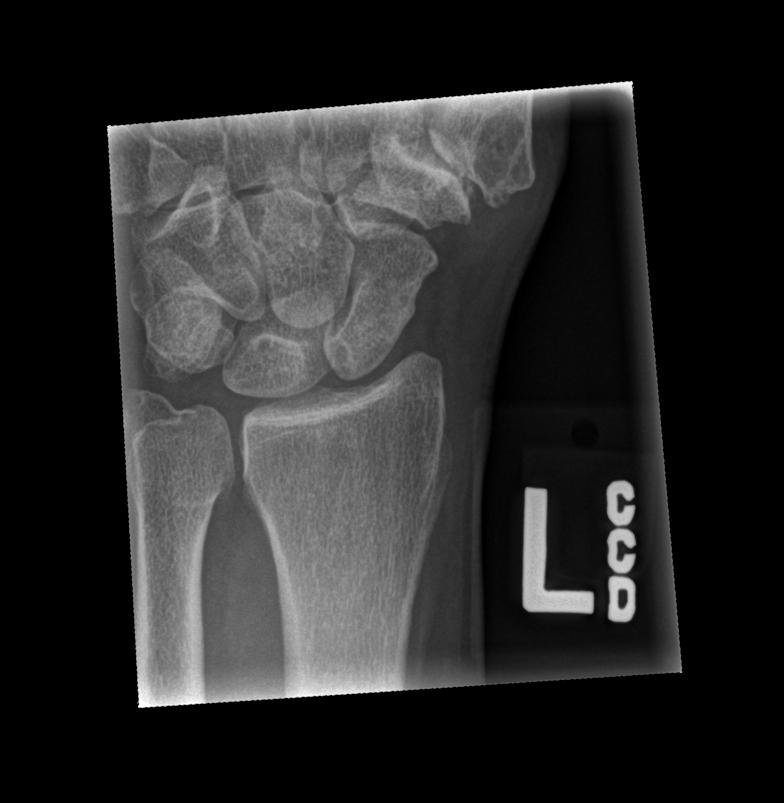

[4 of 4 positions shown; findings below may reference images not displayed]

FINDINGS: There is no evidence of fracture or dislocation. At least moderate
first carpometacarpal joint degenerative changes. No aggressive
appearing focal bone abnormality. Soft tissues are unremarkable.
IMPRESSION: No acute displaced fracture or dislocation left wrist.

## 2022-10-22 ENCOUNTER — Ambulatory Visit (INDEPENDENT_AMBULATORY_CARE_PROVIDER_SITE_OTHER): Payer: Medicare (Managed Care) | Admitting: Podiatry

## 2022-10-22 ENCOUNTER — Encounter: Payer: Self-pay | Admitting: Podiatry

## 2022-10-22 DIAGNOSIS — M21612 Bunion of left foot: Secondary | ICD-10-CM | POA: Diagnosis not present

## 2022-10-22 DIAGNOSIS — M2012 Hallux valgus (acquired), left foot: Secondary | ICD-10-CM

## 2022-10-22 DIAGNOSIS — M2011 Hallux valgus (acquired), right foot: Secondary | ICD-10-CM

## 2022-10-22 DIAGNOSIS — E119 Type 2 diabetes mellitus without complications: Secondary | ICD-10-CM

## 2022-10-22 DIAGNOSIS — M21611 Bunion of right foot: Secondary | ICD-10-CM | POA: Diagnosis not present

## 2022-10-24 ENCOUNTER — Ambulatory Visit (INDEPENDENT_AMBULATORY_CARE_PROVIDER_SITE_OTHER): Payer: Medicare (Managed Care)

## 2022-10-24 DIAGNOSIS — M2012 Hallux valgus (acquired), left foot: Secondary | ICD-10-CM

## 2022-10-24 DIAGNOSIS — M21611 Bunion of right foot: Secondary | ICD-10-CM

## 2022-10-24 DIAGNOSIS — E119 Type 2 diabetes mellitus without complications: Secondary | ICD-10-CM

## 2022-10-24 DIAGNOSIS — M21612 Bunion of left foot: Secondary | ICD-10-CM

## 2022-10-24 DIAGNOSIS — M2011 Hallux valgus (acquired), right foot: Secondary | ICD-10-CM

## 2022-10-24 NOTE — Progress Notes (Signed)
Patient presents to the office today for diabetic shoe and insole measuring.  Patient was measured with brannock device to determine size and width for 1 pair of extra depth shoes and foam casted for 3 pair of insoles.   ABN signed.   Documentation of medical necessity will be sent to patient's treating diabetic doctor to verify and sign.   Patient's diabetic provider: Kristie Cowman, MD   Shoes and insoles will be ordered at that time and patient will be notified for an appointment for fitting when they arrive.   Brannock measurement: 8.5 M  Patient shoe selection-   1st   Shoe choice:   [id number]  Shoe size ordered: 8.5 M

## 2022-10-27 NOTE — Progress Notes (Signed)
  Subjective:  Patient ID: Casey Peterson, female    DOB: 1956/08/31,  MRN: [id number]  Chief Complaint  Patient presents with   Diabetes    New Patient, diabetic foot care    67 y.o. female presents with the above complaint. History confirmed with patient.  She is here for evaluation because she has diabetes.  Objective:  Physical Exam: warm, good capillary refill, bunions, no trophic changes or ulcerative lesions, normal DP and PT pulses, normal monofilament exam, and normal sensory exam.  Assessment:   1. Encounter for diabetic foot exam ([place])   2. Hallux valgus with bunions, left   3. Hallux valgus with bunions, right   4. Controlled type 2 diabetes mellitus without complication, without long-term current use of insulin ([place])      Plan:  Patient was evaluated and treated and all questions answered.  Patient educated on diabetes. Discussed proper diabetic foot care and discussed risks and complications of disease. Educated patient in depth on reasons to return to the office immediately should he/she discover anything concerning or new on the feet. All questions answered. Discussed proper shoes as well.   She does have foot deformity with hallux valgus and bunions.  Currently not painful.  Recommended nonoperative treatment and we discussed proper shoe gear.  She is interested in diabetic shoes.  She will be scheduled for fitting for these.  I will see her back in 1 year for annual diabetic foot examination  Return in about 1 year (around 10/23/2023) for diabetic foot examination .

## 2023-05-07 NOTE — Progress Notes (Signed)
   Cardiology Clinic Note   Date: 05/09/2023 ID: Casey, Peterson 11/03/1955, MRN [id number]  Primary Cardiologist:  Thurmon Fair, MD  Patient Profile    Casey Peterson is a 67 y.o. female who presents to the clinic today for routine follow up.     Past medical history significant for: CAD. LHC 06/17/2017 (NSTEMI): Ostial ramus 55%.  No PCI option.  Recommend treatment with aspirin and Plavix secondary to presentation of ACS. Hypertension. Hyperlipidemia. Migraines. GERD.     History of Present Illness    Casey Peterson was first evaluated by cardiology in October 2018 during hospital admission for NSTEMI.  She was found to have nonobstructive CAD per angiography.  She is followed by Dr. Royann Shivers for the above outlined history.  Patient was last seen in the office by Dr. Royann Shivers on 05/14/2022 for routine follow-up.  She was doing well at that time and no changes were made.  Today, patient is here alone. She is doing well. Patient denies shortness of breath or dyspnea on exertion. No chest pain, pressure, or tightness. Denies lower extremity edema, orthopnea, or PND. No palpitations. She walks for exercise and goes to the gym for strength training 2-3 times a week.      ROS: All other systems reviewed and are otherwise negative except as noted in History of Present Illness.  Studies Reviewed    EKG Interpretation Date/Time:  Friday May 09 2023 15:00:56 EDT Ventricular Rate:  70 PR Interval:  168 QRS Duration:  80 QT Interval:  400 QTC Calculation: 432 R Axis:   -20  Text Interpretation: Normal sinus rhythm Minimal voltage criteria for LVH, may be normal variant ( R in aVL ) Septal infarct , age undetermined When compared with ECG of 16-Jun-2017 15:13, PREVIOUS ECG IS PRESENT Confirmed by Carlos Levering [phone]) on 05/09/2023 3:26:13 PM            Physical Exam    VS:  BP 120/80   Pulse 69   Ht 5' 1.5" (1.562 m)   Wt 163 lb (73.9 kg)   SpO2 97%   BMI 30.30 kg/m  , BMI Body  mass index is 30.3 kg/m.  GEN: Well nourished, well developed, in no acute distress. Neck: No JVD or carotid bruits. Cardiac:  RRR. No murmurs. No rubs or gallops.   Respiratory:  Respirations regular and unlabored. Clear to auscultation without rales, wheezing or rhonchi. GI: Soft, nontender, nondistended. Extremities: Radials/DP/PT 2+ and equal bilaterally. No clubbing or cyanosis. No edema.  Skin: Warm and dry, no rash. Neuro: Strength intact.  Assessment & Plan    Nonobstructive CAD.  Per angiography October 2018 in the setting of NSTEMI. Patient denies chest pain, pressure or tightness. She walks for exercise and strength trains at the gym 2-3 times a week. Continue aspirin, rosuvastatin. Hypertension: BP today 120/80.  Patient denies headaches, dizziness or vision changes. Continue hydrochlorothiazide, losartan. Hyperlipidemia. Dr. Yetta Barre follows patient's labs. Will request latest labs from his office.   Disposition: Return in 1 year or sooner as needed.          Signed, Etta Grandchild. Alyn Jurney, DNP, NP-C

## 2023-05-09 ENCOUNTER — Encounter: Payer: Self-pay | Admitting: Student

## 2023-05-09 ENCOUNTER — Ambulatory Visit: Payer: Medicare (Managed Care) | Attending: Student | Admitting: Student

## 2023-05-09 VITALS — BP 120/80 | HR 69 | Ht 61.5 in | Wt 163.0 lb

## 2023-05-09 DIAGNOSIS — I10 Essential (primary) hypertension: Secondary | ICD-10-CM

## 2023-05-09 DIAGNOSIS — I2510 Atherosclerotic heart disease of native coronary artery without angina pectoris: Secondary | ICD-10-CM

## 2023-05-09 DIAGNOSIS — E785 Hyperlipidemia, unspecified: Secondary | ICD-10-CM | POA: Diagnosis not present

## 2023-05-09 NOTE — Patient Instructions (Signed)
 Medication Instructions:  Your physician recommends that you continue on your current medications as directed. Please refer to the Current Medication list given to you today.  *If you need a refill on your cardiac medications before your next appointment, please call your pharmacy*   Lab Work: NONE ordered at this time of appointment     Testing/Procedures: NONE ordered at this time of appointment     Follow-Up: At [facility], you and your health needs are our priority.  As part of our continuing mission to provide you with exceptional heart care, we have created designated Provider Care Teams.  These Care Teams include your primary Cardiologist (physician) and Advanced Practice Providers (APPs -  Physician Assistants and Nurse Practitioners) who all work together to provide you with the care you need, when you need it.  We recommend signing up for the patient portal called "MyChart".  Sign up information is provided on this After Visit Summary.  MyChart is used to connect with patients for Virtual Visits (Telemedicine).  Patients are able to view lab/test results, encounter notes, upcoming appointments, etc.  Non-urgent messages can be sent to your provider as well.   To learn more about what you can do with MyChart, go to [email or web address].    Your next appointment:   1 year(s)  Provider:   Thurmon Fair, MD
